# Patient Record
Sex: Female | Born: 1935 | Race: Black or African American | Hispanic: No | Smoking: Never smoker
Health system: Southern US, Community
[De-identification: ages and names within clinical notes are randomized; demographics above are authoritative.]

## PROBLEM LIST (undated history)

## (undated) DIAGNOSIS — K222 Esophageal obstruction: Secondary | ICD-10-CM

## (undated) DIAGNOSIS — K219 Gastro-esophageal reflux disease without esophagitis: Secondary | ICD-10-CM

## (undated) DIAGNOSIS — L2089 Other atopic dermatitis: Secondary | ICD-10-CM

## (undated) DIAGNOSIS — R7309 Other abnormal glucose: Secondary | ICD-10-CM

## (undated) DIAGNOSIS — D649 Anemia, unspecified: Secondary | ICD-10-CM

## (undated) DIAGNOSIS — K5900 Constipation, unspecified: Secondary | ICD-10-CM

## (undated) DIAGNOSIS — M545 Low back pain: Secondary | ICD-10-CM

## (undated) DIAGNOSIS — F329 Major depressive disorder, single episode, unspecified: Secondary | ICD-10-CM

## (undated) DIAGNOSIS — M2550 Pain in unspecified joint: Secondary | ICD-10-CM

## (undated) DIAGNOSIS — R609 Edema, unspecified: Secondary | ICD-10-CM

## (undated) DIAGNOSIS — I82409 Acute embolism and thrombosis of unspecified deep veins of unspecified lower extremity: Secondary | ICD-10-CM

## (undated) DIAGNOSIS — I82619 Acute embolism and thrombosis of superficial veins of unspecified upper extremity: Secondary | ICD-10-CM

## (undated) DIAGNOSIS — E7800 Pure hypercholesterolemia, unspecified: Secondary | ICD-10-CM

## (undated) DIAGNOSIS — N289 Disorder of kidney and ureter, unspecified: Principal | ICD-10-CM

## (undated) DIAGNOSIS — E669 Obesity, unspecified: Secondary | ICD-10-CM

## (undated) DIAGNOSIS — F0390 Unspecified dementia without behavioral disturbance: Secondary | ICD-10-CM

## (undated) HISTORY — DX: Low back pain: M54.5

## (undated) HISTORY — DX: Gastro-esophageal reflux disease without esophagitis: K21.9

## (undated) HISTORY — DX: Obesity, unspecified: E66.9

## (undated) HISTORY — DX: Esophageal obstruction: K22.2

## (undated) HISTORY — DX: Edema, unspecified: R60.9

## (undated) HISTORY — DX: Pain in unspecified joint: M25.50

## (undated) HISTORY — DX: Other abnormal glucose: R73.09

## (undated) HISTORY — DX: Major depressive disorder, single episode, unspecified: F32.9

## (undated) HISTORY — DX: Constipation, unspecified: K59.00

## (undated) HISTORY — DX: Anemia, unspecified: D64.9

## (undated) HISTORY — DX: Disorder of kidney and ureter, unspecified: N28.9

## (undated) HISTORY — DX: Unspecified dementia without behavioral disturbance: F03.90

## (undated) HISTORY — DX: Acute embolism and thrombosis of unspecified deep veins of unspecified lower extremity: I82.409

## (undated) HISTORY — DX: Other atopic dermatitis: L20.89

## (undated) HISTORY — DX: Pure hypercholesterolemia, unspecified: E78.00

## (undated) HISTORY — DX: Acute embolism and thrombosis of superficial veins of unspecified upper extremity: I82.619

---

## 1978-12-03 HISTORY — PX: OTHER SURGICAL HISTORY: SHX169

## 1983-12-04 HISTORY — PX: OTHER SURGICAL HISTORY: SHX169

## 2000-11-04 ENCOUNTER — Other Ambulatory Visit: Admission: RE | Admit: 2000-11-04 | Discharge: 2000-11-04 | Payer: Self-pay | Admitting: Emergency Medicine

## 2000-11-12 ENCOUNTER — Encounter: Admission: RE | Admit: 2000-11-12 | Discharge: 2000-11-12 | Payer: Self-pay | Admitting: Emergency Medicine

## 2000-11-12 ENCOUNTER — Encounter: Payer: Self-pay | Admitting: Emergency Medicine

## 2001-07-16 ENCOUNTER — Encounter: Admission: RE | Admit: 2001-07-16 | Discharge: 2001-07-16 | Payer: Self-pay | Admitting: Emergency Medicine

## 2001-07-16 ENCOUNTER — Encounter (INDEPENDENT_AMBULATORY_CARE_PROVIDER_SITE_OTHER): Payer: Self-pay | Admitting: *Deleted

## 2001-07-16 ENCOUNTER — Encounter: Payer: Self-pay | Admitting: Emergency Medicine

## 2004-10-05 ENCOUNTER — Ambulatory Visit: Payer: Self-pay | Admitting: Gastroenterology

## 2004-10-13 ENCOUNTER — Encounter: Admission: RE | Admit: 2004-10-13 | Discharge: 2004-10-13 | Payer: Self-pay | Admitting: Emergency Medicine

## 2004-10-16 ENCOUNTER — Ambulatory Visit: Payer: Self-pay | Admitting: Gastroenterology

## 2004-10-16 LAB — HM COLONOSCOPY

## 2006-07-04 ENCOUNTER — Encounter: Admission: RE | Admit: 2006-07-04 | Discharge: 2006-07-04 | Payer: Self-pay | Admitting: Emergency Medicine

## 2006-07-04 ENCOUNTER — Encounter (INDEPENDENT_AMBULATORY_CARE_PROVIDER_SITE_OTHER): Payer: Self-pay | Admitting: *Deleted

## 2009-05-05 ENCOUNTER — Emergency Department (HOSPITAL_COMMUNITY): Admission: EM | Admit: 2009-05-05 | Discharge: 2009-05-05 | Payer: Self-pay | Admitting: Emergency Medicine

## 2009-08-15 ENCOUNTER — Emergency Department (HOSPITAL_COMMUNITY): Admission: EM | Admit: 2009-08-15 | Discharge: 2009-08-15 | Payer: Self-pay | Admitting: Emergency Medicine

## 2010-03-01 ENCOUNTER — Encounter: Payer: Self-pay | Admitting: Gastroenterology

## 2010-04-12 ENCOUNTER — Encounter: Payer: Self-pay | Admitting: Gastroenterology

## 2010-06-27 ENCOUNTER — Encounter: Payer: Self-pay | Admitting: Gastroenterology

## 2010-06-29 ENCOUNTER — Encounter (INDEPENDENT_AMBULATORY_CARE_PROVIDER_SITE_OTHER): Payer: Self-pay | Admitting: *Deleted

## 2010-08-01 DIAGNOSIS — L2089 Other atopic dermatitis: Secondary | ICD-10-CM

## 2010-08-01 DIAGNOSIS — K5900 Constipation, unspecified: Secondary | ICD-10-CM

## 2010-08-01 DIAGNOSIS — R7309 Other abnormal glucose: Secondary | ICD-10-CM

## 2010-08-01 DIAGNOSIS — M2550 Pain in unspecified joint: Secondary | ICD-10-CM | POA: Insufficient documentation

## 2010-08-01 DIAGNOSIS — K219 Gastro-esophageal reflux disease without esophagitis: Secondary | ICD-10-CM

## 2010-08-01 DIAGNOSIS — N183 Chronic kidney disease, stage 3 (moderate): Secondary | ICD-10-CM

## 2010-08-01 DIAGNOSIS — I82619 Acute embolism and thrombosis of superficial veins of unspecified upper extremity: Secondary | ICD-10-CM

## 2010-08-01 DIAGNOSIS — I10 Essential (primary) hypertension: Secondary | ICD-10-CM | POA: Insufficient documentation

## 2010-08-01 DIAGNOSIS — N289 Disorder of kidney and ureter, unspecified: Secondary | ICD-10-CM

## 2010-08-01 DIAGNOSIS — E7800 Pure hypercholesterolemia, unspecified: Secondary | ICD-10-CM

## 2010-08-01 DIAGNOSIS — M545 Low back pain, unspecified: Secondary | ICD-10-CM

## 2010-08-01 DIAGNOSIS — F329 Major depressive disorder, single episode, unspecified: Secondary | ICD-10-CM

## 2010-08-01 DIAGNOSIS — E669 Obesity, unspecified: Secondary | ICD-10-CM

## 2010-08-01 DIAGNOSIS — F3289 Other specified depressive episodes: Secondary | ICD-10-CM | POA: Insufficient documentation

## 2010-08-01 DIAGNOSIS — K222 Esophageal obstruction: Secondary | ICD-10-CM

## 2010-08-01 HISTORY — DX: Low back pain, unspecified: M54.50

## 2010-08-01 HISTORY — DX: Constipation, unspecified: K59.00

## 2010-08-01 HISTORY — DX: Obesity, unspecified: E66.9

## 2010-08-01 HISTORY — DX: Acute embolism and thrombosis of superficial veins of unspecified upper extremity: I82.619

## 2010-08-01 HISTORY — DX: Pure hypercholesterolemia, unspecified: E78.00

## 2010-08-01 HISTORY — DX: Other atopic dermatitis: L20.89

## 2010-08-01 HISTORY — DX: Disorder of kidney and ureter, unspecified: N28.9

## 2010-08-01 HISTORY — DX: Pain in unspecified joint: M25.50

## 2010-08-01 HISTORY — DX: Major depressive disorder, single episode, unspecified: F32.9

## 2010-08-01 HISTORY — DX: Other specified depressive episodes: F32.89

## 2010-08-01 HISTORY — DX: Esophageal obstruction: K22.2

## 2010-08-01 HISTORY — DX: Other abnormal glucose: R73.09

## 2010-08-01 HISTORY — DX: Gastro-esophageal reflux disease without esophagitis: K21.9

## 2010-08-08 ENCOUNTER — Ambulatory Visit: Payer: Self-pay | Admitting: Gastroenterology

## 2010-08-08 ENCOUNTER — Encounter (INDEPENDENT_AMBULATORY_CARE_PROVIDER_SITE_OTHER): Payer: Self-pay | Admitting: *Deleted

## 2010-08-08 DIAGNOSIS — I82409 Acute embolism and thrombosis of unspecified deep veins of unspecified lower extremity: Secondary | ICD-10-CM | POA: Insufficient documentation

## 2010-08-08 DIAGNOSIS — R609 Edema, unspecified: Secondary | ICD-10-CM

## 2010-08-08 HISTORY — DX: Acute embolism and thrombosis of unspecified deep veins of unspecified lower extremity: I82.409

## 2010-08-08 HISTORY — DX: Edema, unspecified: R60.9

## 2010-08-08 LAB — CONVERTED CEMR LAB
AST: 36 units/L (ref 0–37)
Albumin: 3.9 g/dL (ref 3.5–5.2)
BUN: 28 mg/dL — ABNORMAL HIGH (ref 6–23)
Basophils Relative: 0.6 % (ref 0.0–3.0)
Creatinine, Ser: 1.2 mg/dL (ref 0.4–1.2)
Eosinophils Absolute: 0.1 10*3/uL (ref 0.0–0.7)
Eosinophils Relative: 2.6 % (ref 0.0–5.0)
Folate: 10.9 ng/mL
GFR calc non Af Amer: 55.92 mL/min (ref >60)
Glucose, Bld: 83 mg/dL (ref 70–99)
HCT: 34.9 % — ABNORMAL LOW (ref 36.0–46.0)
Hemoglobin: 12.2 g/dL (ref 12.0–15.0)
Lymphs Abs: 0.9 10*3/uL (ref 0.7–4.0)
MCHC: 35 g/dL (ref 30.0–36.0)
MCV: 102.8 fL — ABNORMAL HIGH (ref 78.0–100.0)
Monocytes Absolute: 0.3 10*3/uL (ref 0.1–1.0)
Neutro Abs: 2.1 10*3/uL (ref 1.4–7.7)
Neutrophils Relative %: 60 % (ref 43.0–77.0)
Potassium: 4.6 meq/L (ref 3.5–5.1)
RBC: 3.4 M/uL — ABNORMAL LOW (ref 3.87–5.11)
TSH: 0.58 microintl units/mL (ref 0.35–5.50)
Vitamin B-12: 755 pg/mL (ref 211–911)
WBC: 3.5 10*3/uL — ABNORMAL LOW (ref 4.5–10.5)

## 2010-08-10 ENCOUNTER — Encounter: Payer: Self-pay | Admitting: Cardiovascular Disease

## 2010-08-10 ENCOUNTER — Encounter: Admission: RE | Admit: 2010-08-10 | Discharge: 2010-08-10 | Payer: Self-pay | Admitting: Internal Medicine

## 2010-08-11 ENCOUNTER — Encounter: Admission: RE | Admit: 2010-08-11 | Discharge: 2010-08-11 | Payer: Self-pay | Admitting: Internal Medicine

## 2010-08-11 ENCOUNTER — Telehealth: Payer: Self-pay | Admitting: Gastroenterology

## 2010-08-11 ENCOUNTER — Encounter: Payer: Self-pay | Admitting: Cardiovascular Disease

## 2010-08-16 ENCOUNTER — Ambulatory Visit: Payer: Self-pay | Admitting: Gastroenterology

## 2010-08-17 ENCOUNTER — Ambulatory Visit: Payer: Self-pay | Admitting: Internal Medicine

## 2010-08-17 LAB — CONVERTED CEMR LAB
Prothrombin Time: 11.5 s (ref 9.7–11.8)
aPTT: 31.1 s — ABNORMAL HIGH (ref 21.7–28.8)

## 2010-08-18 ENCOUNTER — Encounter: Payer: Self-pay | Admitting: Gastroenterology

## 2010-08-18 ENCOUNTER — Telehealth: Payer: Self-pay | Admitting: Internal Medicine

## 2010-08-21 ENCOUNTER — Ambulatory Visit: Payer: Self-pay | Admitting: Cardiovascular Disease

## 2010-08-21 LAB — CONVERTED CEMR LAB: POC INR: 1.6

## 2010-08-24 ENCOUNTER — Telehealth: Payer: Self-pay | Admitting: Internal Medicine

## 2010-08-25 ENCOUNTER — Ambulatory Visit: Payer: Self-pay | Admitting: Internal Medicine

## 2010-08-25 ENCOUNTER — Ambulatory Visit: Payer: Self-pay

## 2010-08-25 ENCOUNTER — Encounter: Payer: Self-pay | Admitting: Cardiovascular Disease

## 2010-08-25 LAB — CONVERTED CEMR LAB: POC INR: 2.2

## 2010-09-05 ENCOUNTER — Ambulatory Visit: Payer: Self-pay | Admitting: Cardiology

## 2010-09-06 ENCOUNTER — Ambulatory Visit: Payer: Self-pay | Admitting: Vascular Surgery

## 2010-09-19 ENCOUNTER — Ambulatory Visit: Payer: Self-pay | Admitting: Cardiology

## 2010-09-20 ENCOUNTER — Ambulatory Visit: Payer: Self-pay | Admitting: Internal Medicine

## 2010-09-21 ENCOUNTER — Telehealth: Payer: Self-pay | Admitting: Internal Medicine

## 2010-09-22 ENCOUNTER — Telehealth: Payer: Self-pay | Admitting: Internal Medicine

## 2010-10-17 ENCOUNTER — Encounter: Payer: Self-pay | Admitting: Internal Medicine

## 2010-10-17 ENCOUNTER — Ambulatory Visit: Payer: Self-pay | Admitting: Cardiovascular Disease

## 2010-10-17 LAB — HM MAMMOGRAPHY: HM Mammogram: NORMAL

## 2010-10-18 ENCOUNTER — Telehealth: Payer: Self-pay | Admitting: Internal Medicine

## 2010-10-31 ENCOUNTER — Telehealth: Payer: Self-pay | Admitting: Internal Medicine

## 2010-11-02 ENCOUNTER — Telehealth: Payer: Self-pay | Admitting: Internal Medicine

## 2010-11-09 ENCOUNTER — Ambulatory Visit: Payer: Self-pay | Admitting: Internal Medicine

## 2010-11-09 DIAGNOSIS — D649 Anemia, unspecified: Secondary | ICD-10-CM

## 2010-11-09 DIAGNOSIS — D638 Anemia in other chronic diseases classified elsewhere: Secondary | ICD-10-CM

## 2010-11-09 DIAGNOSIS — F0390 Unspecified dementia without behavioral disturbance: Secondary | ICD-10-CM

## 2010-11-09 HISTORY — DX: Anemia, unspecified: D64.9

## 2010-11-09 HISTORY — DX: Unspecified dementia, unspecified severity, without behavioral disturbance, psychotic disturbance, mood disturbance, and anxiety: F03.90

## 2010-11-09 LAB — CONVERTED CEMR LAB
Basophils Absolute: 0 10*3/uL (ref 0.0–0.1)
Bilirubin, Direct: 0.1 mg/dL (ref 0.0–0.3)
Calcium: 10.6 mg/dL — ABNORMAL HIGH (ref 8.4–10.5)
Creatinine, Ser: 1.1 mg/dL (ref 0.4–1.2)
Eosinophils Absolute: 0.1 10*3/uL (ref 0.0–0.7)
GFR calc non Af Amer: 59.93 mL/min — ABNORMAL LOW (ref >60.00)
HCT: 36.4 % (ref 36.0–46.0)
Hemoglobin: 12.6 g/dL (ref 12.0–15.0)
Hgb A1c MFr Bld: 5.4 % (ref 4.6–6.5)
Lymphs Abs: 1 10*3/uL (ref 0.7–4.0)
MCHC: 34.4 g/dL (ref 30.0–36.0)
Monocytes Absolute: 0.3 10*3/uL (ref 0.1–1.0)
Neutro Abs: 1.7 10*3/uL (ref 1.4–7.7)
RDW: 12.7 % (ref 11.5–14.6)
Total Bilirubin: 0.5 mg/dL (ref 0.3–1.2)
Total Protein: 7.6 g/dL (ref 6.0–8.3)

## 2010-11-14 ENCOUNTER — Ambulatory Visit: Payer: Self-pay

## 2010-11-17 ENCOUNTER — Telehealth: Payer: Self-pay | Admitting: Internal Medicine

## 2010-11-20 ENCOUNTER — Telehealth: Payer: Self-pay | Admitting: Internal Medicine

## 2010-11-24 ENCOUNTER — Telehealth: Payer: Self-pay | Admitting: Internal Medicine

## 2010-11-30 ENCOUNTER — Telehealth: Payer: Self-pay | Admitting: Internal Medicine

## 2010-11-30 ENCOUNTER — Encounter: Payer: Self-pay | Admitting: Internal Medicine

## 2010-12-11 ENCOUNTER — Ambulatory Visit: Admission: RE | Admit: 2010-12-11 | Discharge: 2010-12-11 | Payer: Self-pay | Source: Home / Self Care

## 2010-12-11 LAB — CONVERTED CEMR LAB: POC INR: 2.2

## 2011-01-02 ENCOUNTER — Telehealth: Payer: Self-pay | Admitting: Internal Medicine

## 2011-01-04 NOTE — Assessment & Plan Note (Signed)
Summary: New / Medicare / # cd   Vital Signs:  Patient profile:   77 year old female Height:      61 inches Weight:      185 pounds BMI:     35.08 O2 Sat:      98 % on Room air Temp:     99.2 degrees F oral Pulse rate:   80 / minute Pulse rhythm:   regular Resp:     16 per minute BP sitting:   160 / 80  (left arm) Cuff size:   regular  Vitals Entered By: Lanier Prude, Beverly Gust) (August 17, 2010 1:18 PM)  Nutrition Counseling: Patient's BMI is greater than 25 and therefore counseled on weight management options.  O2 Flow:  Room air CC: Lt leg pain/discoloration X 6 mo, Preventive Care Is Patient Diabetic? No Comments Est PCP. pt is taking Lasix 1 qd but is unsure of dose.   Primary Care Provider:  Etta Grandchild MD  CC:  Lt leg pain/discoloration X 6 mo and Preventive Care.  History of Present Illness: New to me she requests treatment for blood clots in her legs. The details are sketchy to me due to the patient and family's difficulty with details but she did have an U/S done on 09/06 and it showed a left greater saphenous vein partially occlusive thrombus. She tells me that has been on Coumadin in the past but her prior PCP stopped it for reasons that she can't recall and those records are not available to me today. She did not have any bleeding complications.  Preventive Screening-Counseling & Management  Alcohol-Tobacco     Alcohol drinks/day: 0     Alcohol Counseling: not indicated; patient does not drink     Smoking Status: never     Tobacco Counseling: not indicated; no tobacco use  Clinical Review Panels:  Prevention   Last Colonoscopy:  DONE (08/16/2010)  Diabetes Management   Creatinine:  1.2 (08/08/2010)  CBC   WBC:  3.5 (08/08/2010)   RBC:  3.40 (08/08/2010)   Hgb:  12.2 (08/08/2010)   Hct:  34.9 (08/08/2010)   Platelets:  159.0 (08/08/2010)   MCV  102.8 (08/08/2010)   MCHC  35.0 (08/08/2010)   RDW  12.8 (08/08/2010)   PMN:  60.0  (08/08/2010)   Lymphs:  27.3 (08/08/2010)   Monos:  9.5 (08/08/2010)   Eosinophils:  2.6 (08/08/2010)   Basophil:  0.6 (08/08/2010)  Complete Metabolic Panel   Glucose:  83 (08/08/2010)   Sodium:  141 (08/08/2010)   Potassium:  4.6 (08/08/2010)   Chloride:  106 (08/08/2010)   CO2:  28 (08/08/2010)   BUN:  28 (08/08/2010)   Creatinine:  1.2 (08/08/2010)   Albumin:  3.9 (08/08/2010)   Total Protein:  7.4 (08/08/2010)   Calcium:  10.3 (08/08/2010)   Total Bili:  0.6 (08/08/2010)   Alk Phos:  65 (08/08/2010)   SGPT (ALT):  23 (08/08/2010)   SGOT (AST):  36 (08/08/2010)   Medications Prior to Update: 1)  Metoprolol Tartrate 25 Mg Tabs (Metoprolol Tartrate) .... Take One By Mouth Two Times A Day 2)  Amlodipine Besylate 10 Mg Tabs (Amlodipine Besylate) .... Take One By Mouth Once Daily 3)  Calcium 500+d 500-400 Mg-Unit Tabs (Calcium Carbonate-Vitamin D) .... Take One By Mouth Three Times A Day 4)  Clonidine Hcl 0.2 Mg Tabs (Clonidine Hcl) .... Take One By Mouth Two Times A Day 5)  Citalopram Hydrobromide 10 Mg Tabs (Citalopram Hydrobromide) .... Take  One By Mouth Once Daily 6)  Klor-Con M20 20 Meq Cr-Tabs (Potassium Chloride Crys Cr) .... Take One By Mouth Two Times A Day 7)  Omeprazole 20 Mg Cpdr (Omeprazole) .... Take One By Mouth Once Daily 8)  Pravastatin Sodium 40 Mg Tabs (Pravastatin Sodium) .... Take One By Mouth Once Daily 9)  Moviprep 100 Gm  Solr (Peg-Kcl-Nacl-Nasulf-Na Asc-C) .... As Per Prep Instructions. 10)  Miralax  Powd (Polyethylene Glycol 3350) .... Use One Capfull in 8 Ounces of Water and Drink Nightly  Current Medications (verified): 1)  Metoprolol Tartrate 25 Mg Tabs (Metoprolol Tartrate) .... Take One By Mouth Two Times A Day 2)  Amlodipine Besylate 10 Mg Tabs (Amlodipine Besylate) .... Take One By Mouth Once Daily 3)  Calcium 500+d 500-400 Mg-Unit Tabs (Calcium Carbonate-Vitamin D) .... Take One By Mouth Three Times A Day 4)  Clonidine Hcl 0.2 Mg Tabs (Clonidine  Hcl) .... Take One By Mouth Two Times A Day 5)  Citalopram Hydrobromide 10 Mg Tabs (Citalopram Hydrobromide) .... Take One By Mouth Once Daily 6)  Klor-Con M20 20 Meq Cr-Tabs (Potassium Chloride Crys Cr) .... Take One By Mouth Two Times A Day 7)  Omeprazole 20 Mg Cpdr (Omeprazole) .... Take One By Mouth Once Daily 8)  Pravastatin Sodium 40 Mg Tabs (Pravastatin Sodium) .... Take One By Mouth Once Daily 9)  Moviprep 100 Gm  Solr (Peg-Kcl-Nacl-Nasulf-Na Asc-C) .... As Per Prep Instructions. 10)  Miralax  Powd (Polyethylene Glycol 3350) .... Use One Capfull in 8 Ounces of Water and Drink Nightly 11)  Keflex 500 Mg Caps (Cephalexin) .... 2 Once Daily 12)  Tylenol With Codeine #3 300-30 Mg Tabs (Acetaminophen-Codeine) .Marland Kitchen.. 1 At Bedtime 13)  Coumadin 5 Mg Tabs (Warfarin Sodium) .... One By Mouth Once Daily  Allergies (verified): 1)  ! Amlodipine Besylate  Past History:  Past Medical History: Last updated: 08/01/2010 Current Problems:  PERSONAL HISTORY OF COLONIC POLYPS (ICD-V12.72) HEMORRHOIDS (ICD-455.6) GERD (ICD-530.81) ESOPHAGEAL STRICTURE (ICD-530.3) HIATAL HERNIA (ICD-553.3) ENCOUNTER FOR LONG-TERM USE OF ANTICOAGULANTS (ICD-V58.61) HYPERGLYCEMIA (ICD-790.29) COUGH (ICD-786.2) WEIGHT LOSS, ABNORMAL (ICD-783.21) MEMORY LOSS (ICD-780.93) BACK PAIN, LUMBAR (ICD-724.2) ARTHRALGIA (ICD-719.40) ECZEMA, ATOPIC (ICD-691.8) RENAL DISEASE (ICD-593.9) CONSTIPATION (ICD-564.00) ACUTE VENOUS EMBO & THROMB SUP VEINS UPPER EXT (ICD-453.81) HYPERTENSION (ICD-401.9) DEPRESSION (ICD-311) OBESITY, UNSPECIFIED (ICD-278.00) HYPOKALEMIA (ICD-276.8) HYPERCHOLESTEROLEMIA (ICD-272.0)  Past Surgical History: Last updated: 08/01/2010 1980-tumor removed 1985-blood clots removed  Family History: Last updated: 08/08/2010 Family History of Colon Cancer:Brother Family History of Diabetes: Brother  Social History: Last updated: 08/01/2010 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  Alcohol Use - no Daily Caffeine Use  Risk Factors: Alcohol Use: 0 (08/17/2010)  Risk Factors: Smoking Status: never (08/17/2010)  Family History: Reviewed history from 08/08/2010 and no changes required. Family History of Colon Cancer:Brother Family History of Diabetes: Brother  Social History: Reviewed history from 08/01/2010 and no changes required. 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  Alcohol Use - no Daily Caffeine Use  Review of Systems       The patient complains of weight gain and peripheral edema.  The patient denies anorexia, fever, weight loss, hoarseness, chest pain, syncope, prolonged cough, headaches, hemoptysis, abdominal pain, hematuria, suspicious skin lesions, abnormal bleeding, enlarged lymph nodes, angioedema, and breast masses.   General:  Denies chills, fatigue, fever, loss of appetite, malaise, sleep disorder, and sweats. Heme:  Denies abnormal bruising, bleeding, enlarge lymph nodes,  fevers, pallor, and skin discoloration.  Physical Exam  General:  alert, well-developed, well-nourished, well-hydrated, appropriate dress, normal appearance, healthy-appearing, cooperative to examination, and overweight-appearing.   Head:  normocephalic, atraumatic, no abnormalities observed, and no abnormalities palpated.   Mouth:  Oral mucosa and oropharynx without lesions or exudates.  Teeth in good repair. Neck:  supple, full ROM, no masses, no thyromegaly, no JVD, normal carotid upstroke, no carotid bruits, no cervical lymphadenopathy, and no neck tenderness.   Breasts:  skin/areolae normal, no masses, no abnormal thickening, no nipple discharge, no tenderness, and no adenopathy.   Lungs:  normal respiratory effort, no intercostal retractions, no accessory muscle use, normal breath sounds, no dullness, no fremitus, no crackles,  and no wheezes.   Heart:  normal rate, regular rhythm, no murmur, no gallop, no rub, and no JVD.   Abdomen:  soft, non-tender, normal bowel sounds, no distention, no masses, no guarding, no rigidity, no rebound tenderness, no abdominal hernia, no inguinal hernia, no hepatomegaly, and no splenomegaly.   Msk:  normal ROM, no joint tenderness, no joint swelling, no joint warmth, no redness over joints, no joint deformities, no joint instability, no crepitation, and no muscle atrophy.   Pulses:  R radial normal, L radial normal, R femoral decreased, R popliteal decreased, R posterior tibial decreased, R dorsalis pedis decreased, L femoral decreased, L popliteal decreased, L posterior tibial decreased, and L dorsalis pedis decreased.   Extremities:  2+ left pedal edema with brawny induration and hyperpigmentation and 1+ right pedal edema.   Neurologic:  alert & oriented X3, cranial nerves II-XII intact, strength normal in all extremities, sensation intact to light touch, sensation intact to pinprick, gait normal, and DTRs symmetrical and normal.   Psych:  normally interactive, good eye contact, not anxious appearing, not depressed appearing, easily distracted, poor concentration, and memory impairment.     Impression & Recommendations:  Problem # 1:  ENCOUNTER FOR LONG-TERM USE OF ANTICOAGULANTS (ICD-V58.61) Assessment Unchanged  Orders: Venipuncture (14782) TLB-PTT (85730-PTTL) TLB-PT (Protime) (85610-PTP) T-D-Dimer Fibrin Derivatives Quantitive ([id number])  Problem # 2:  ACUTE VENOUS EMBO & THROMB SUP VEINS UPPER EXT (ICD-453.81) Assessment: Deteriorated she may still having clot forming and she is certainly symptomatic with pain and swelling so I think she shoud restart Coumadin Orders: [facility] Referral (Coumadin clinic) Venipuncture (78469) TLB-PTT (85730-PTTL) TLB-PT (Protime) (85610-PTP) T-D-Dimer Fibrin Derivatives Quantitive ([id number])  Problem # 3:   HYPERTENSION (ICD-401.9) Assessment: Deteriorated  The following medications were removed from the medication list:    Amlodipine Besylate 10 Mg Tabs (Amlodipine besylate) .Marland Kitchen... Take one by mouth once daily Her updated medication list for this problem includes:    Metoprolol Tartrate 25 Mg Tabs (Metoprolol tartrate) .Marland Kitchen... Take one by mouth two times a day    Clonidine Hcl 0.2 Mg Tabs (Clonidine hcl) .Marland Kitchen... Take one by mouth two times a day    Losartan Potassium-hctz 50-12.5 Mg Tabs (Losartan potassium-hctz) ..... One by mouth once daily for high blood pressure  Problem # 4:  EDEMA (ICD-782.3) Assessment: Deteriorated will stop norvasc Her updated medication list for this problem includes:    Losartan Potassium-hctz 50-12.5 Mg Tabs (Losartan potassium-hctz) ..... One by mouth once daily for high blood pressure  Complete Medication List: 1)  Metoprolol Tartrate 25 Mg Tabs (Metoprolol tartrate) .... Take one by mouth two times a day 2)  Calcium 500+d 500-400 Mg-unit Tabs (Calcium carbonate-vitamin d) .... Take one by mouth three times a day 3)  Clonidine Hcl 0.2 Mg  Tabs (Clonidine hcl) .... Take one by mouth two times a day 4)  Citalopram Hydrobromide 10 Mg Tabs (Citalopram hydrobromide) .... Take one by mouth once daily 5)  Klor-con M20 20 Meq Cr-tabs (Potassium chloride crys cr) .... Take one by mouth two times a day 6)  Omeprazole 20 Mg Cpdr (Omeprazole) .... Take one by mouth once daily 7)  Pravastatin Sodium 40 Mg Tabs (Pravastatin sodium) .... Take one by mouth once daily 8)  Moviprep 100 Gm Solr (Peg-kcl-nacl-nasulf-na asc-c) .... As per prep instructions. 9)  Miralax Powd (Polyethylene glycol 3350) .... Use one capfull in 8 ounces of water and drink nightly 10)  Keflex 500 Mg Caps (Cephalexin) .... 2 once daily 11)  Tylenol With Codeine #3 300-30 Mg Tabs (Acetaminophen-codeine) .Marland Kitchen.. 1 at bedtime 12)  Coumadin 5 Mg Tabs (Warfarin sodium) .... One by mouth once daily 13)  Losartan  Potassium-hctz 50-12.5 Mg Tabs (Losartan potassium-hctz) .... One by mouth once daily for high blood pressure  Other Orders: Radiology Referral (Radiology)  PAP Screening:    Reviewed PAP smear recommendations:  patient refuses understanding risks of delayed diagnosis  Mammogram Screening:    Reviewed Mammogram recommendations:  mammogram ordered  Osteoporosis Risk Assessment:  Risk Factors for Fracture or Low Bone Density:   Smoking status:       never  Patient Instructions: 1)  Please schedule a follow-up appointment in 1 month. Prescriptions: LOSARTAN POTASSIUM-HCTZ 50-12.5 MG TABS (LOSARTAN POTASSIUM-HCTZ) One by mouth once daily for high blood pressure  #30 x 11   Entered and Authorized by:   Etta Grandchild MD   Signed by:   Etta Grandchild MD on 08/17/2010   Method used:   Electronically to        [facility] (retail)       [address]       [place], [place]  [address]       Ph: [phone]       Fax: [phone]   RxID:   [id number] COUMADIN 5 MG TABS (WARFARIN SODIUM) One by mouth once daily  #30 x 11   Entered and Authorized by:   Etta Grandchild MD   Signed by:   Etta Grandchild MD on 08/17/2010   Method used:   Electronically to        [facility] (retail)       [address]       [place], [place]  [address]       Ph: [phone]       Fax: [phone]   RxID:   [id number]

## 2011-01-04 NOTE — Letter (Signed)
Summary: [facility] Instructions  [facility]  [address] [place], [place] [address]   Phone: [phone]  Fax: [phone]       Annette Mckay    Aug 24, 1927    MRN: [id number]        Procedure Day Dorna Bloom: Wednesday 08/16/2010     Arrival Time: 2:30pm       Procedure Time: 3:30pm     Location of Procedure:                    X  [facility] (4th Floor)   PREPARATION FOR COLONOSCOPY WITH MOVIPREP   Starting 5 days prior to your procedure 08/11/2010 do not eat nuts, seeds, popcorn, corn, beans, peas,  salads, or any raw vegetables.  Do not take any fiber supplements (e.g. Metamucil, Citrucel, and Benefiber).  THE DAY BEFORE YOUR PROCEDURE         DATE: 08/15/2010  DAY: Tuesday  1.  Drink clear liquids the entire day-NO SOLID FOOD  2.  Do not drink anything colored red or purple.  Avoid juices with pulp.  No orange juice.  3.  Drink at least 64 oz. (8 glasses) of fluid/clear liquids during the day to prevent dehydration and help the prep work efficiently.  CLEAR LIQUIDS INCLUDE: Water Jello Ice Popsicles Tea (sugar ok, no milk/cream) Powdered fruit flavored drinks Coffee (sugar ok, no milk/cream) Gatorade Juice: apple, white grape, white cranberry  Lemonade Clear bullion, consomm, broth Carbonated beverages (any kind) Strained chicken noodle soup Hard Candy                             4.  In the morning, mix first dose of MoviPrep solution:    Empty 1 Pouch A and 1 Pouch B into the disposable container    Add lukewarm drinking water to the top line of the container. Mix to dissolve    Refrigerate (mixed solution should be used within 24 hrs)  5.  Begin drinking the prep at 5:00 p.m. The MoviPrep container is divided by 4 marks.   Every 15 minutes drink the solution down to the next mark (approximately 8 oz) until the full liter is complete.   6.  Follow completed prep with 16 oz of clear liquid of your choice (Nothing red or purple).   Continue to drink clear liquids until bedtime.  7.  Before going to bed, mix second dose of MoviPrep solution:    Empty 1 Pouch A and 1 Pouch B into the disposable container    Add lukewarm drinking water to the top line of the container. Mix to dissolve    Refrigerate  THE DAY OF YOUR PROCEDURE      DATE: 08/16/2010 DAY: Wednesday  Beginning at 10:30am (5 hours before procedure):         1. Every 15 minutes, drink the solution down to the next mark (approx 8 oz) until the full liter is complete.  2. Follow completed prep with 16 oz. of clear liquid of your choice.    3. You may drink clear liquids until 1:30pm (2 HOURS BEFORE PROCEDURE).   MEDICATION INSTRUCTIONS  Unless otherwise instructed, you should take regular prescription medications with a small sip of water   as early as possible the morning of your procedure.          OTHER INSTRUCTIONS  You will need a responsible adult at least 75 years  of age to accompany you and drive you home.   This person must remain in the waiting room during your procedure.  Wear loose fitting clothing that is easily removed.  Leave jewelry and other valuables at home.  However, you may wish to bring a book to read or  an iPod/MP3 player to listen to music as you wait for your procedure to start.  Remove all body piercing jewelry and leave at home.  Total time from sign-in until discharge is approximately 2-3 hours.  You should go home directly after your procedure and rest.  You can resume normal activities the  day after your procedure.  The day of your procedure you should not:   Drive   Make legal decisions   Operate machinery   Drink alcohol   Return to work  You will receive specific instructions about eating, activities and medications before you leave.    The above instructions have been reviewed and explained to me by   _______________________    I fully understand and can verbalize these instructions  _____________________________ Date _________

## 2011-01-04 NOTE — Procedures (Signed)
Summary: EGD   EGD  Procedure date:  10/16/2004  Findings:      Location: [facility]   Patient Name: Annette Mckay, Annette Mckay MRN:  Procedure Procedures: Panendoscopy (EGD) CPT: 43235.    with esophageal dilation. CPT: G9296129.  Personnel: Endoscopist: Vania Rea. Jarold Motto, MD.  Exam Location: Exam performed in Outpatient Clinic. Outpatient  Patient Consent: Procedure, Alternatives, Risks and Benefits discussed, consent obtained, from patient. Consent was obtained by the RN.  Indications Symptoms: Reflux symptoms for >10 yrs, occurring daily.  History  Current Medications: Patient is currently taking Coumadin.  Pre-Exam Physical: Performed Oct 16, 2004  Cardio-pulmonary exam, Abdominal exam, Extremity exam, Mental status exam WNL.  Exam Exam Info: Maximum depth of insertion Duodenum, intended Duodenum. Patient position: on left side. Duration of exam: 15 minutes. Vocal cords visualized. Gastric retroflexion performed. Images taken. ASA Classification: II. Tolerance: excellent.  Sedation Meds: Patient assessed and found to be appropriate for moderate (conscious) sedation. Cetacaine Spray 2 sprays given aerosolized. Versed 2 mg. given IV.  Monitoring: BP and pulse monitoring done. Oximetry used. Supplemental O2 given at 2 Liters.  Instrument(s): GIF 160. Serial [id number].   Findings - HIATAL HERNIA: Prolapsing, 5 cms. in length. ICD9: Hernia, Hiatal: 553.3. - STRICTURE / STENOSIS: Distal Esophagus.  Constriction: partial. Lumen diameter is 14 mm. ICD9: Esophageal Stricture: 530.3.  - Dilation: Distal Esophagus. Maloney dilator used, Diameter: 56 F, Minimal Resistance, No Heme present on extraction. 1  total dilators used. Patient tolerance excellent. Outcome: successful.  - Normal: Body to Duodenal 2nd Portion. Not Seen: Tumor. Ulcer. Mucosal abnormality. AVM's. Foreign body.   Assessment  Diagnoses: 553.3: Hernia, Hiatal. Chronic GERD.  530.3: Esophageal  Stricture.   Events  Unplanned Intervention: No unplanned interventions were required.  Plans Medication(s): Continue current medications.  Patient Education: Patient given standard instructions for: Reflux.  Disposition: After procedure patient sent to recovery. After recovery patient sent home.  Scheduling: Follow-up prn.    CC: Leslee Home, MD  This report was created from the original endoscopy report, which was reviewed and signed by the above listed endoscopist.

## 2011-01-04 NOTE — Progress Notes (Signed)
Summary: rx refill req  Phone Note Refill Request Message from:  Fax from Pharmacy on November 30, 2010 9:15 AM  Refills Requested: Medication #1:  TYLENOL WITH CODEINE #3 300-30 MG TABS 1 at bedtime   Dosage confirmed as above?Dosage Confirmed   Supply Requested: 6 months   Last Refilled: 08/15/2010 please advise   Method Requested: Fax to Local Pharmacy Next Appointment Scheduled: 01/18/2011 Initial call taken by: Brenton Grills CMA [facility] Dull),  November 30, 2010 9:16 AM  Follow-up for Phone Call        ok Follow-up by: Etta Grandchild MD,  November 30, 2010 9:21 AM    Prescriptions: TYLENOL WITH CODEINE #3 300-30 MG TABS (ACETAMINOPHEN-CODEINE) 1 at bedtime  #15 x 5   Entered by:   Brenton Grills CMA (AAMA)   Authorized by:   Etta Grandchild MD   Signed by:   Brenton Grills CMA (AAMA) on 11/30/2010   Method used:   Telephoned to ...       [facility] (retail)       [address]       [place], [place]  [address]       Ph: [phone]       Fax: [phone]   RxID:   [id number]

## 2011-01-04 NOTE — Progress Notes (Signed)
Summary: Lab results  Phone Note Call from Patient Call back at Home Phone [phone]   Call For: Dr Jarold Motto Reason for Call: Lab or Test Results Summary of Call: wants to know about her labs Initial call taken by: Leanor Kail [facility],  August 11, 2010 1:08 PM  Follow-up for Phone Call        Left a message on patients machine to call back if she has questions but all her labs are normal.  Follow-up by: Harlow Mares CMA (AAMA),  August 11, 2010 2:05 PM

## 2011-01-04 NOTE — Letter (Signed)
Summary: New Patient letter  [facility]  [address] [place], [place] [address]   Phone: [phone]  Fax: [phone]       06/29/2010 MRN: [id number]  [facility] [address] [place], [place]  [address]  Dear Ms. Annette Mckay,  Welcome to the Gastroenterology Division at [facility].    You are scheduled to see Dr.  Jarold Motto on 9-6-11p.m. at 2:00p.m. on the 3rd floor at [facility], [address]  We ask that you try to arrive at our office 15 minutes prior to your appointment time to allow for check-in.  We would like you to complete the enclosed self-administered evaluation form prior to your visit and bring it with you on the day of your appointment.  We will review it with you.  Also, please bring a complete list of all your medications or, if you prefer, bring the medication bottles and we will list them.  Please bring your insurance card so that we may make a copy of it.  If your insurance requires a referral to see a specialist, please bring your referral form from your primary care physician.  Co-payments are due at the time of your visit and may be paid by cash, check or credit card.     Your office visit will consist of a consult with your physician (includes a physical exam), any laboratory testing he/she may order, scheduling of any necessary diagnostic testing (e.g. x-ray, ultrasound, CT-scan), and scheduling of a procedure (e.g. Endoscopy, Colonoscopy) if required.  Please allow enough time on your schedule to allow for any/all of these possibilities.    If you cannot keep your appointment, please call [phone] to cancel or reschedule prior to your appointment date.  This allows [place] the opportunity to schedule an appointment for another patient in need of care.  If you do not cancel or reschedule by 5 p.m. the business day prior to your appointment date, you will be charged a $50.00 late cancellation/no-show fee.    Thank you for  choosing [facility] for your medical needs.  We appreciate the opportunity to care for you.  Please visit [place] at our website  to learn more about our practice.                     Sincerely,                                                             The Gastroenterology Division

## 2011-01-04 NOTE — Medication Information (Signed)
Summary: ccn/mt  Anticoagulant Therapy  Managed by: Geoffry Paradise, PharmD Referring MD: Yetta Barre MD PCP: Etta Grandchild MD Supervising MD: Excell Seltzer MD, Casimiro Needle Indication 1: DVT Lab Used: [facility] [place] Site: [address] INR POC 1.6 INR RANGE 2-3  Vital Signs: Weight: 180 lbs.     Dietary changes: no    Health status changes: no    Bleeding/hemorrhagic complications: no    Recent/future hospitalizations: no    Any changes in medication regimen? no    Recent/future dental: no  Any missed doses?: no       Is patient compliant with meds? yes       Allergies: 1)  ! Amlodipine Besylate  Anticoagulation Management History:      The patient comes in today for her initial visit for anticoagulation therapy.  Positive risk factors for bleeding include an age of 75 years or older.  The bleeding index is 'intermediate risk'.  Positive CHADS2 values include History of HTN and Age > 31 years old.  Her last INR was 1.1 ratio.  Anticoagulation responsible provider: Excell Seltzer MD, Casimiro Needle.  INR POC: 1.6.  Cuvette Lot#: [id number].  Exp: 09/2011.    Anticoagulation Management Assessment/Plan:      The patient's current anticoagulation dose is Coumadin 5 mg tabs: One by mouth once daily.  The next INR is due 08/28/2010.  Results were reviewed/authorized by Geoffry Paradise, PharmD.         Current Anticoagulation Instructions: INR: 1.6  Please continue to take 1 tablet (5mg ) everyday.

## 2011-01-04 NOTE — Miscellaneous (Signed)
Summary: Orders Update  Clinical Lists Changes  Orders: Added new Test order of Venous Duplex Lower Extremity (Venous Duplex Lower) - Signed 

## 2011-01-04 NOTE — Medication Information (Signed)
Summary: rov/ewj  Anticoagulant Therapy  Managed by: Bethena Midget, RN, BSN Referring MD: Yetta Barre MD PCP: Etta Grandchild MD Supervising MD: Myrtis Ser MD, Tinnie Gens Indication 1: DVT Lab Used: [facility] [place] Site: [address] INR POC 2.4 INR RANGE 2-3  Dietary changes: no    Health status changes: no    Bleeding/hemorrhagic complications: no    Recent/future hospitalizations: no    Any changes in medication regimen? no    Recent/future dental: no  Any missed doses?: no       Is patient compliant with meds? yes       Allergies: 1)  ! Amlodipine Besylate  Anticoagulation Management History:      The patient is taking warfarin and comes in today for a routine follow up visit.  Positive risk factors for bleeding include an age of 75 years or older.  The bleeding index is 'intermediate risk'.  Positive CHADS2 values include History of HTN and Age > 75 years old.  Her last INR was 1.1 ratio.  Anticoagulation responsible provider: Myrtis Ser MD, Tinnie Gens.  INR POC: 2.4.  Cuvette Lot#: [id number].  Exp: 10/2011.    Anticoagulation Management Assessment/Plan:      The patient's current anticoagulation dose is Coumadin 5 mg tabs: One by mouth once daily.  The target INR is 2.0-3.0.  The next INR is due 10/17/2010.  Anticoagulation instructions were given to patient.  Results were reviewed/authorized by Bethena Midget, RN, BSN.  She was notified by Bethena Midget, RN, BSN.         Prior Anticoagulation Instructions: INR 2.7  Continue on same dosage 1 tablet daily.  Recheck in  2 weeks.    Current Anticoagulation Instructions: INR 2.4 Continue 5mg s everyday. Recheck in 4 weeks.

## 2011-01-04 NOTE — Procedures (Signed)
Summary: Colonoscopy  Patient: Annette Mckay Note: All result statuses are Final unless otherwise noted.  Tests: (1) Colonoscopy (COL)   COL Colonoscopy           DONE     [facility]     [address]     [place], [place]  [address]           COLONOSCOPY PROCEDURE REPORT           PATIENT:  Annette Mckay, Annette Mckay  MR#:  [id number]     BIRTHDATE:  August 24, 1927, 83 yrs. old  GENDER:  female     ENDOSCOPIST:  Vania Rea. Jarold Motto, MD, [facility]     REF. BY:     PROCEDURE DATE:  08/16/2010     PROCEDURE:  Colonoscopy with biopsy     ASA CLASS:  Class II     INDICATIONS:  history of pre-cancerous (adenomatous) colon polyps           MEDICATIONS:   Fentanyl 50 mcg IV, Versed 4 mg IV           DESCRIPTION OF PROCEDURE:   After the risks benefits and     alternatives of the procedure were thoroughly explained, informed     consent was obtained.  POOR RECTAL TONE. The LB [id number]-[id number]     endoscope was introduced through the anus and advanced to the     cecum, which was identified by both the appendix and ileocecal     valve, without limitations.  The quality of the prep was adequate,     using MoviPrep.  The instrument was then slowly withdrawn as the     colon was fully examined.     <<PROCEDUREIMAGES>>           FINDINGS:  A sessile polyp was found in the sigmoid colon. COLD     SNARE REMOVAL OF 3 MM POLYP.  This was otherwise a normal     examination of the colon.   Retroflexed views in the rectum     revealed no abnormalities.    The scope was then withdrawn from     the patient and the procedure completed.           COMPLICATIONS:  None     ENDOSCOPIC IMPRESSION:     1) Sessile polyp in the sigmoid colon     2) Otherwise normal examination     R/O ADENOMA.     RECOMMENDATIONS:     F/U ONLY AS NEEDED PER HER AGE.     REPEAT EXAM:  No           ______________________________     Vania Rea. Jarold Motto, MD, Clementeen Graham           CC:  Doran Durand MD           n.     Rosalie DoctorMarland Kitchen    Vania Rea. Patterson at 08/16/2010 04:17 PM           Fransico Meadow, [id number]  Note: An exclamation mark (!) indicates a result that was not dispersed into the flowsheet. Document Creation Date: 08/16/2010 4:17 PM _______________________________________________________________________  (1) Order result status: Final Collection or observation date-time: 08/16/2010 16:02 Requested date-time:  Receipt date-time:  Reported date-time:  Referring Physician:   Ordering Physician: Sheryn Bison [phone]) Specimen Source:  Source: [name] Order Number: [id number] Lab site:

## 2011-01-04 NOTE — Medication Information (Signed)
Summary: rov/ewj  Anticoagulant Therapy  Managed by: Lyna Poser, PharmD Referring MD: Yetta Barre MD PCP: Etta Grandchild MD Supervising MD: Excell Seltzer MD, Casimiro Needle Indication 1: DVT Lab Used: [facility] [place] Site: [address] INR POC 2.2 INR RANGE 2-3  Dietary changes: no    Health status changes: no    Bleeding/hemorrhagic complications: no    Recent/future hospitalizations: no    Any changes in medication regimen? yes       Details: restarted coumadin last friday after colonscopy  Recent/future dental: no  Any missed doses?: no       Is patient compliant with meds? yes       Allergies: 1)  ! Amlodipine Besylate  Anticoagulation Management History:      Positive risk factors for bleeding include an age of 75 years or older.  The bleeding index is 'intermediate risk'.  Positive CHADS2 values include History of HTN and Age > 25 years old.  Her last INR was 1.1 ratio.  Anticoagulation responsible provider: Excell Seltzer MD, Casimiro Needle.  INR POC: 2.2.  Exp: 09/2011.    Anticoagulation Management Assessment/Plan:      The patient's current anticoagulation dose is Coumadin 5 mg tabs: One by mouth once daily.  The next INR is due 08/28/2010.  Results were reviewed/authorized by Lyna Poser, PharmD.         Prior Anticoagulation Instructions: INR: 1.6  Please continue to take 1 tablet (5mg ) everyday.   Current Anticoagulation Instructions: INR 2.2 Continue taking 1 tablet daily. No changes today. Recheck in 10 days.

## 2011-01-04 NOTE — Assessment & Plan Note (Signed)
Summary: discuss colon age--ch.   History of Present Illness Visit Type: Initial Consult Primary GI MD: Sheryn Bison MD FACP FAGA Primary Provider: Doran Durand, MD Requesting Provider: Doran Durand, MD Chief Complaint: Colon screening, patient has problems with gas, dark stools, constipation & hemorrhoids History of Present Illness:   77 year old American female who had colonoscopy and polypectomy in 2005. She has recurrent chronic constipation but denies other GI complaints. She is referred by Dr. Allena  for colonoscopy followup. She has a history of deep vein thrombosis and chronic edema of her lower extremities with associated pain and immobility. She also has a history of fatty liver confirmed by CT scan in 2003. Review of her labs today shows a creatinine of 1.4 mg percent and mild hypercalcemia. She is on potassium replacement and antihypertensive medication.  Besides constipation she denies other GI complaints. She takes omeprazole daily for acid reflux.Over the Last 5 years she has lost 20 pounds in weight.She has an older brother who died with colon cancer.   GI Review of Systems    Reports belching, bloating, and  weight loss.      Denies abdominal pain, acid reflux, chest pain, dysphagia with liquids, dysphagia with solids, heartburn, loss of appetite, nausea, vomiting, vomiting blood, and  weight gain.      Reports black tarry stools, constipation, and  hemorrhoids.     Denies anal fissure, change in bowel habit, diarrhea, diverticulosis, fecal incontinence, heme positive stool, irritable bowel syndrome, jaundice, light color stool, liver problems, rectal bleeding, and  rectal pain. Preventive Screening-Counseling & Management  Alcohol-Tobacco     Smoking Status: never    Current Medications (verified): 1)  Metoprolol Tartrate 25 Mg Tabs (Metoprolol Tartrate) .... Take One By Mouth Two Times A Day 2)  Amlodipine Besylate 10 Mg Tabs (Amlodipine Besylate) .... Take One  By Mouth Once Daily 3)  Calcium 500+d 500-400 Mg-Unit Tabs (Calcium Carbonate-Vitamin D) .... Take One By Mouth Three Times A Day 4)  Clonidine Hcl 0.2 Mg Tabs (Clonidine Hcl) .... Take One By Mouth Two Times A Day 5)  Citalopram Hydrobromide 10 Mg Tabs (Citalopram Hydrobromide) .... Take One By Mouth Once Daily 6)  Klor-Con M20 20 Meq Cr-Tabs (Potassium Chloride Crys Cr) .... Take One By Mouth Two Times A Day 7)  Omeprazole 20 Mg Cpdr (Omeprazole) .... Take One By Mouth Once Daily 8)  Pravastatin Sodium 40 Mg Tabs (Pravastatin Sodium) .... Take One By Mouth Once Daily  Allergies (verified): No Known Drug Allergies  Past History:  Past medical, surgical, family and social histories (including risk factors) reviewed for relevance to current acute and chronic problems.  Past Medical History: Reviewed history from 08/01/2010 and no changes required. Current Problems:  PERSONAL HISTORY OF COLONIC POLYPS (ICD-V12.72) HEMORRHOIDS (ICD-455.6) GERD (ICD-530.81) ESOPHAGEAL STRICTURE (ICD-530.3) HIATAL HERNIA (ICD-553.3) ENCOUNTER FOR LONG-TERM USE OF ANTICOAGULANTS (ICD-V58.61) HYPERGLYCEMIA (ICD-790.29) COUGH (ICD-786.2) WEIGHT LOSS, ABNORMAL (ICD-783.21) MEMORY LOSS (ICD-780.93) BACK PAIN, LUMBAR (ICD-724.2) ARTHRALGIA (ICD-719.40) ECZEMA, ATOPIC (ICD-691.8) RENAL DISEASE (ICD-593.9) CONSTIPATION (ICD-564.00) ACUTE VENOUS EMBO & THROMB SUP VEINS UPPER EXT (ICD-453.81) HYPERTENSION (ICD-401.9) DEPRESSION (ICD-311) OBESITY, UNSPECIFIED (ICD-278.00) HYPOKALEMIA (ICD-276.8) HYPERCHOLESTEROLEMIA (ICD-272.0)  Past Surgical History: Reviewed history from 08/01/2010 and no changes required. 1980-tumor removed 1985-blood clots removed  Family History: Reviewed history and no changes required. Family History of Colon Cancer:Brother Family History of Diabetes: Brother  Social History: Reviewed history from 08/01/2010 and no changes required. Occupation: retired Advertising copywriter only  finished 2 grade Widowed lives with her son [name] Patient has never  smoked.  Alcohol Use - no Daily Caffeine Use  Review of Systems       The patient complains of allergy/sinus, cough, swelling of feet/legs, and urine leakage.   General:  Complains of fatigue and weakness; denies fever, chills, sweats, anorexia, malaise, weight loss, and sleep disorder. CV:  Complains of peripheral edema; denies chest pains, angina, palpitations, syncope, dyspnea on exertion, orthopnea, PND, and claudication. Resp:  Complains of cough; denies dyspnea at rest, sputum, wheezing, coughing up blood, and pleurisy. GU:  Complains of urinary incontinence; denies urinary burning, blood in urine, nocturnal urination, urinary frequency, abnormal vaginal bleeding, amenorrhea, menorrhagia, vaginal discharge, pelvic pain, genital sores, painful intercourse, and decreased libido. MS:  Complains of joint pain / LOM, low back pain, and leg pain at night; denies joint swelling, joint stiffness, joint deformity, muscle weakness, muscle cramps, muscle atrophy, leg pain with exertion, and shoulder pain / LOM hand / wrist pain (CTS). Neuro:  Complains of difficulty walking; denies weakness, paralysis, abnormal sensation, seizures, syncope, tremors, vertigo, transient blindness, frequent falls, frequent headaches, headache, sciatica, radiculopathy other:, restless legs, memory loss, and confusion. Psych:  Complains of memory loss; denies depression, anxiety, suicidal ideation, hallucinations, paranoia, phobia, and confusion. Heme:  Denies bruising, bleeding, enlarged lymph nodes, and pagophagia; Coumadin was recently discontinued by primary care..  Vital Signs:  Patient profile:   77 year old female Height:      61 inches Weight:      182.38 pounds BMI:     34.58 Pulse rate:   60 / minute Pulse rhythm:   regular BP sitting:   138 / 70  (left arm) Cuff size:   regular  Vitals Entered By: June McMurray CMA Duncan Dull) (August 08, 2010 1:32 PM)  Physical Exam  General:  Well developed, well nourished, no acute distress.healthy appearing.   Head:  Normocephalic and atraumatic. Eyes:  PERRLA, no icterus.exam deferred to patient's ophthalmologist.   Lungs:  Clear throughout to auscultation. Heart:  Regular rate and rhythm; no murmurs, rubs,  or bruits. Abdomen:  Soft, nontender and nondistended. No masses, hepatosplenomegaly or hernias noted. Normal bowel sounds.Scarred and deformed lower abdominal area from previous surgery. Rectal:  deferred until time of colonoscopy.   Msk:  Symmetrical with no gross deformities. Normal posture.decreased ROM.   Extremities:  1+ pedal edema.  left leg greater than right. Both legs are tender to touch he has stasis dermatitis. Neurologic:  Alert and  oriented x4;  grossly normal neurologically.confused.   Psych:  Alert and cooperative. Normal mood and affect.poor concentration.     Impression & Recommendations:  Problem # 1:  PERSONAL HISTORY OF COLONIC POLYPS (ICD-V12.72) Assessment Unchanged  Followup colonoscopy with balanced electrolyte preparation. I will repeat her labs including CBC and anemia profile. Review of her record shows a recent negative Hemoccult card but she has a very strong family history of colon cancer and had a previous right cecal polyp removed. Pathology results showed that no specimen was really recovered.  Orders: TLB-CBC Platelet - w/Differential (85025-CBCD) TLB-BMP (Basic Metabolic Panel-BMET) (80048-METABOL) TLB-Hepatic/Liver Function Pnl (80076-HEPATIC) TLB-TSH (Thyroid Stimulating Hormone) (84443-TSH) TLB-B12, Serum-Total ONLY (16109-U04) TLB-Ferritin (82728-FER) TLB-Folic Acid (Folate) (82746-FOL) TLB-IBC Pnl (Iron/FE;Transferrin) (83550-IBC)  Problem # 2:  DVT (ICD-453.40) Assessment: Unchanged  Chronic edema and tenderness of her lower extremities with impaired mobilization. I did not start diuretics but will seek primary care  evaluation first. She does have some mild chronic renal insufficiency.  Orders: TLB-CBC Platelet - w/Differential (85025-CBCD) TLB-BMP (Basic  Metabolic Panel-BMET) (80048-METABOL) TLB-Hepatic/Liver Function Pnl (80076-HEPATIC) TLB-TSH (Thyroid Stimulating Hormone) (84443-TSH) TLB-B12, Serum-Total ONLY (16109-U04) TLB-Ferritin (82728-FER) TLB-Folic Acid (Folate) (82746-FOL) TLB-IBC Pnl (Iron/FE;Transferrin) (83550-IBC)  Problem # 3:  GERD (ICD-530.81) Assessment: Improved continue omeprazole therapy.  Problem # 4:  WEIGHT LOSS, ABNORMAL (ICD-783.21) Assessment: Unchanged  Problem # 5:  RENAL DISEASE (ICD-593.9) Assessment: Unchanged hypercalcemia probably associated  CRF from hypertensive cardiovascular disease.   Problem # 6:  CONSTIPATION (ICD-564.00) Assessment: Deteriorated trial of MiraLax q.h.s.  Patient Instructions: 1)  Please go to the basement today for your labs.  2)  Your prescription has been sent to you pharmacy.  3)  We have referred you to a new Primary Care, Dr. Sanda Linger on the first floor on 08/03/2010 arrive at 1:00pm. 4)  We have scheduled your colonoscopy please follow the seperate instructions.  5)  Start Miralax one capful in 8 ounces of water everynight.  6)  The medication list was reviewed and reconciled.  All changed / newly prescribed medications were explained.  A complete medication list was provided to the patient / caregiver. Prescriptions: MOVIPREP 100 GM  SOLR (PEG-KCL-NACL-NASULF-NA ASC-C) As per prep instructions.  #1 x 0   Entered by:   Harlow Mares CMA (AAMA)   Authorized by:   Mardella Layman MD [facility]   Signed by:   Mardella Layman MD [facility] on 08/08/2010   Method used:   Electronically to        [facility] (retail)       [address]       [place], [place]  [address]       Ph: [phone]       Fax: [phone]   RxID:   [id number]   Appended Document: Orders Update    Clinical  Lists Changes  Orders: Added new Test order of Colonoscopy (Colon) - Signed Added new Referral order of Primary Care Referral (Primary) - Signed

## 2011-01-04 NOTE — Progress Notes (Signed)
  Phone Note Refill Request Message from:  Patient on October 31, 2010 2:01 PM  Refills Requested: Medication #1:  METOPROLOL TARTRATE 25 MG TABS take one by mouth two times a day Initial call taken by: Rock Nephew CMA,  October 31, 2010 2:01 PM    Prescriptions: METOPROLOL TARTRATE 25 MG TABS (METOPROLOL TARTRATE) take one by mouth two times a day  #60 x 5   Entered by:   Rock Nephew CMA   Authorized by:   Etta Grandchild MD   Signed by:   Rock Nephew CMA on 10/31/2010   Method used:   Electronically to        [facility] (retail)       [address]       [place], [place]  [address]       Ph: [phone]       Fax: [phone]   RxID:   [id number]

## 2011-01-04 NOTE — Progress Notes (Signed)
Summary: MED ?  Phone Note Call from Patient   Summary of Call: Pt left a mess, said she was previously on norvasc qd, metoprolol bid, clonidine two times a day and losartan once daily. Patient wants to know if she should continue all this medication.  Initial call taken by: Lamar Sprinkles, CMA,  September 21, 2010 11:38 AM  Follow-up for Phone Call        i think norvasc was stopped due to edema Follow-up by: Etta Grandchild MD,  September 21, 2010 11:45 AM  Additional Follow-up for Phone Call Additional follow up Details #1::        Pt informed  Additional Follow-up by: Lamar Sprinkles, CMA,  September 21, 2010 5:06 PM

## 2011-01-04 NOTE — Progress Notes (Signed)
  Phone Note Call from Patient   Caller: Patient Summary of Call: pt called to find out what medications she was told to stop taking again.  Initial call taken by: Alysia Penna,  September 22, 2010 1:21 PM  Follow-up for Phone Call        called pt back and advised that it was the Norvasc that she is to stop taking.  Follow-up by: Alysia Penna,  September 22, 2010 1:22 PM

## 2011-01-04 NOTE — Progress Notes (Signed)
  Phone Note Call from Patient   Summary of Call: Patient is requesting a call regarding her medications.  Initial call taken by: Lamar Sprinkles, CMA,  November 02, 2010 12:25 PM  Follow-up for Phone Call        pt has question on all her medications. I advised pt that she needs an office visit to discuss this with the Doctor. Pt states she would arrange for transportation and call monday morning to sch an appt Follow-up by: Ami Bullins CMA,  November 03, 2010 4:51 PM

## 2011-01-04 NOTE — Miscellaneous (Signed)
Summary: [facility]   Imported By: Lennie Odor 08/11/2010 12:24:05  _____________________________________________________________________  External Attachment:    Type:   Image     Comment:   External Document

## 2011-01-04 NOTE — Progress Notes (Signed)
  Phone Note Call from Patient Call back at Home Phone [phone] Call back at [phone]   Caller: Patient Summary of Call: Pt requesting lab results from a week ago. Initial call taken by: Verdell Face,  August 24, 2010 1:25 PM  Follow-up for Phone Call        Dr. Yetta Barre is out of office today. Pt is calling for results 08/17/10. Pls advise Follow-up by: Orlan Leavens RMA,  August 24, 2010 3:17 PM  Additional Follow-up for Phone Call Additional follow up Details #1::        Patient had Protime that was subtherapeutic and should be addressed by the coag clinic. she had a positive D.-dimer.  call to patient: told D-dimer was elevated. her last INR was 1.6 9/19. She is advised to go to coag clinic. Talked with Dr. Tenny Craw (DOD) - if pt subtherapeutic she will need to be covered with lovenox.  Additional Follow-up by: Jacques Navy MD,  August 25, 2010 1:43 PM

## 2011-01-04 NOTE — Letter (Signed)
Summary: Patient Notice- Polyp Results  [facility]  [address] [place], [place] [address]   Phone: [phone]  Fax: [phone]        August 18, 2010 MRN: [id number]    [facility] [address] [place], [place]  [address]    Dear [name],  I am pleased to inform you that the colon polyp(s) removed during your recent colonoscopy was (were) found to be benign (no cancer detected) upon pathologic examination.  I recommend you have a repeat colonoscopy examination in [AS NEEDED]_ years to look for recurrent polyps, as having colon polyps increases your risk for having recurrent polyps or even colon cancer in the future.  Should you develop new or worsening symptoms of abdominal pain, bowel habit changes or bleeding from the rectum or bowels, please schedule an evaluation with either your primary care physician or with me.  Additional information/recommendations:  _X_ No further action with gastroenterology is needed at this time. Please      follow-up with your primary care physician for your other healthcare      needs.  __ Please call [phone] to schedule a return visit to review your      situation.  __ Please keep your follow-up visit as already scheduled.  __ Continue treatment plan as outlined the day of your exam.  Please call us if you are having persistent problems or have questions about your condition that have not been fully answered at this time.  Sincerely,  Mardella Layman MD [facility]  This letter has been electronically signed by your physician.  Appended Document: Patient Notice- Polyp Results letter mailed

## 2011-01-04 NOTE — Progress Notes (Signed)
Summary: Med ?  Phone Note Call from Patient   Summary of Call: pt called requesting  med for leg pain. Also, she is questioning whether she is to cont current Bp med with the one we gave her yest.  Please advise  Initial call taken by: Lanier Prude, [facility]),  August 18, 2010 3:08 PM  Follow-up for Phone Call        yes, stay on current meds but add the new one for better BP control and to decrease edema Follow-up by: Etta Grandchild MD,  August 18, 2010 3:16 PM  Additional Follow-up for Phone Call Additional follow up Details #1::        pt informed  Additional Follow-up by: Lanier Prude, [facility]),  August 18, 2010 5:23 PM    New/Updated Medications: TRAMADOL HCL 50 MG TABS (TRAMADOL HCL) One by mouth QID as needed for pain Prescriptions: TRAMADOL HCL 50 MG TABS (TRAMADOL HCL) One by mouth QID as needed for pain  #100 x 3   Entered and Authorized by:   Etta Grandchild MD   Signed by:   Etta Grandchild MD on 08/18/2010   Method used:   Electronically to        [facility] (retail)       [address]       [place], [place]  [address]       Ph: [phone]       Fax: [phone]   RxID:   [id number]

## 2011-01-04 NOTE — Miscellaneous (Signed)
Summary: Plan/[facility]  Plan/[facility]   Imported By: Lester [place] 12/06/2010 08:19:09  _____________________________________________________________________  External Attachment:    Type:   Image     Comment:   External Document

## 2011-01-04 NOTE — Procedures (Signed)
Summary: Colonoscopy   Colonoscopy  Procedure date:  10/16/2004  Findings:      Location:  [facility].    Procedures Next Due Date:    Colonoscopy: 10/2007 Patient Name: Annette Mckay, Annette Mckay MRN:  Procedure Procedures: Colonoscopy CPT: [address].    with polypectomy. CPT: [id number].  Personnel: Endoscopist: Vania Rea. Jarold Motto, MD.  Exam Location: Exam performed in Outpatient Clinic. Outpatient  Patient Consent: Procedure, Alternatives, Risks and Benefits discussed, consent obtained, from patient. Consent was obtained by the RN.  Indications  Average Risk Screening Routine.  History  Current Medications: Patient is currently taking Coumadin.  Pre-Exam Physical: Performed Oct 16, 2004. Cardio-pulmonary exam, Rectal exam, Abdominal exam, Extremity exam, Mental status exam WNL.  Exam Exam: Extent of exam reached: Cecum, extent intended: Cecum.  The cecum was identified by appendiceal orifice and IC valve. Patient position: on left side. Duration of exam: 20 minutes. Colon retroflexion performed. Images taken. ASA Classification: II. Tolerance: excellent.  Monitoring: Pulse and BP monitoring, Oximetry used. Supplemental O2 given. at 2 Liters.  Colon Prep Used Golytely for colon prep. Prep results: good.  Sedation Meds: Patient assessed and found to be appropriate for moderate (conscious) sedation. Fentanyl 50 mcg. given IV. Versed 4 mg. given IV.  Instrument(s): CF 140L. Serial [id number].  Findings - POLYP: Transverse Colon, diminutive, Procedure:  snare with cautery, removed, not retrieved,  - POLYP: Cecum, Maximum size: 7 mm. sessile polyp. Procedure:  snare with cautery, removed, retrieved, Polyp sent to pathology. ICD9: Colon Polyps: 211.3.  - NORMAL EXAM: Cecum to Rectum. Not Seen: Polyps. AVM's. Colitis. Tumors. Crohn's. Diverticulosis.  - HEMORRHOIDS: External. Size: Medium. Not bleeding. Not thrombosed. ICD9: Hemorrhoids, External: 455.3.    Assessment  Diagnoses: 211.3: Colon Polyps.  455.3: Hemorrhoids, External.   Events  Unplanned Interventions: No intervention was required.  Plans  Post Exam Instructions: No aspirin or non-steroidal containing medications: 2 weeks.  Medication Plan: Referring Elnora Quizon to order medications.  Patient Education: Patient given standard instructions for: Polyps. Patient instructed to get routine colonoscopy every 3 years.  Comments: Hold coumadin for 1 week. Disposition: After procedure patient sent to recovery. After recovery patient sent home.  Scheduling/Referral: Follow-Up prn. Await pathology to schedule patient.    CC: Reuben Likes, MD  This report was created from the original endoscopy report, which was reviewed and signed by the above listed endoscopist.

## 2011-01-04 NOTE — Assessment & Plan Note (Signed)
Summary: FU Natale Milch #   Vital Signs:  Patient profile:   77 year old female Menstrual status:  postmenopausal Height:      61 inches Weight:      188 pounds BMI:     35.65 O2 Sat:      96 % on Room air Temp:     98.3 degrees F oral Pulse rate:   59 / minute Pulse rhythm:   regular Resp:     18 per minute BP sitting:   140 / 88  (left arm) Cuff size:   regular  Vitals Entered By: Alysia Penna (September 20, 2010 2:35 PM)  Nutrition Counseling: Patient's BMI is greater than 25 and therefore counseled on weight management options.  O2 Flow:  Room air CC: pt here for follow up visit. /cp sma Comments pt no longer taking keflex or tylenol with codeine.     Menstrual Status postmenopausal   Primary Care Provider:  Etta Grandchild MD  CC:  pt here for follow up visit. /cp sma.  History of Present Illness:  Follow-Up Visit      This is an 77 year old woman who presents for Follow-up visit.  The patient complains of edema, but denies chest pain, palpitations, dizziness, syncope, SOB, DOE, PND, and orthopnea.  Since the last visit the patient notes no new problems or concerns.  The patient reports taking meds as prescribed, monitoring BP, and dietary compliance.  When questioned about possible medication side effects, the patient notes none.    Preventive Screening-Counseling & Management  Alcohol-Tobacco     Alcohol drinks/day: 0     Alcohol Counseling: not indicated; patient does not drink     Smoking Status: never     Tobacco Counseling: not indicated; no tobacco use  Hep-HIV-STD-Contraception     Hepatitis Risk: no risk noted     HIV Risk: no risk noted     STD Risk: no risk noted  Clinical Review Panels:  Prevention   Last Colonoscopy:  DONE (08/16/2010)  Immunizations   Last Tetanus Booster:  Tdap (09/20/2010)   Last Flu Vaccine:  Fluvax 3+ (09/20/2010)  Diabetes Management   Creatinine:  1.2 (08/08/2010)   Last Flu Vaccine:  Fluvax 3+ (09/20/2010)  CBC  WBC:  3.5 (08/08/2010)   RBC:  3.40 (08/08/2010)   Hgb:  12.2 (08/08/2010)   Hct:  34.9 (08/08/2010)   Platelets:  159.0 (08/08/2010)   MCV  102.8 (08/08/2010)   MCHC  35.0 (08/08/2010)   RDW  12.8 (08/08/2010)   PMN:  60.0 (08/08/2010)   Lymphs:  27.3 (08/08/2010)   Monos:  9.5 (08/08/2010)   Eosinophils:  2.6 (08/08/2010)   Basophil:  0.6 (08/08/2010)  Complete Metabolic Panel   Glucose:  83 (08/08/2010)   Sodium:  141 (08/08/2010)   Potassium:  4.6 (08/08/2010)   Chloride:  106 (08/08/2010)   CO2:  28 (08/08/2010)   BUN:  28 (08/08/2010)   Creatinine:  1.2 (08/08/2010)   Albumin:  3.9 (08/08/2010)   Total Protein:  7.4 (08/08/2010)   Calcium:  10.3 (08/08/2010)   Total Bili:  0.6 (08/08/2010)   Alk Phos:  65 (08/08/2010)   SGPT (ALT):  23 (08/08/2010)   SGOT (AST):  36 (08/08/2010)   Medications Prior to Update: 1)  Metoprolol Tartrate 25 Mg Tabs (Metoprolol Tartrate) .... Take One By Mouth Two Times A Day 2)  Calcium 500+d 500-400 Mg-Unit Tabs (Calcium Carbonate-Vitamin D) .... Take One By Mouth Three Times A Day  3)  Clonidine Hcl 0.2 Mg Tabs (Clonidine Hcl) .... Take One By Mouth Two Times A Day 4)  Citalopram Hydrobromide 10 Mg Tabs (Citalopram Hydrobromide) .... Take One By Mouth Once Daily 5)  Klor-Con M20 20 Meq Cr-Tabs (Potassium Chloride Crys Cr) .... Take One By Mouth Two Times A Day 6)  Omeprazole 20 Mg Cpdr (Omeprazole) .... Take One By Mouth Once Daily 7)  Pravastatin Sodium 40 Mg Tabs (Pravastatin Sodium) .... Take One By Mouth Once Daily 8)  Miralax  Powd (Polyethylene Glycol 3350) .... Use One Capfull in 8 Ounces of Water and Drink Nightly 9)  Keflex 500 Mg Caps (Cephalexin) .... 2 Once Daily 10)  Tylenol With Codeine #3 300-30 Mg Tabs (Acetaminophen-Codeine) .Marland Kitchen.. 1 At Bedtime 11)  Coumadin 5 Mg Tabs (Warfarin Sodium) .... One By Mouth Once Daily 12)  Losartan Potassium-Hctz 50-12.5 Mg Tabs (Losartan Potassium-Hctz) .... One By Mouth Once Daily For High  Blood Pressure 13)  Tramadol Hcl 50 Mg Tabs (Tramadol Hcl) .... One By Mouth Qid As Needed For Pain  Current Medications (verified): 1)  Metoprolol Tartrate 25 Mg Tabs (Metoprolol Tartrate) .... Take One By Mouth Two Times A Day 2)  Calcium 500+d 500-400 Mg-Unit Tabs (Calcium Carbonate-Vitamin D) .... Take One By Mouth Three Times A Day 3)  Clonidine Hcl 0.2 Mg Tabs (Clonidine Hcl) .... Take One By Mouth Two Times A Day 4)  Citalopram Hydrobromide 10 Mg Tabs (Citalopram Hydrobromide) .... Take One By Mouth Once Daily 5)  Klor-Con M20 20 Meq Cr-Tabs (Potassium Chloride Crys Cr) .... Take One By Mouth Two Times A Day 6)  Omeprazole 20 Mg Cpdr (Omeprazole) .... Take One By Mouth Once Daily 7)  Pravastatin Sodium 40 Mg Tabs (Pravastatin Sodium) .... Take One By Mouth Once Daily 8)  Miralax  Powd (Polyethylene Glycol 3350) .... Use One Capfull in 8 Ounces of Water and Drink Nightly 9)  Keflex 500 Mg Caps (Cephalexin) .... 2 Once Daily 10)  Tylenol With Codeine #3 300-30 Mg Tabs (Acetaminophen-Codeine) .Marland Kitchen.. 1 At Bedtime 11)  Coumadin 5 Mg Tabs (Warfarin Sodium) .... One By Mouth Once Daily 12)  Losartan Potassium-Hctz 50-12.5 Mg Tabs (Losartan Potassium-Hctz) .... One By Mouth Once Daily For High Blood Pressure 13)  Tramadol Hcl 50 Mg Tabs (Tramadol Hcl) .... One By Mouth Qid As Needed For Pain  Allergies (verified): 1)  ! Amlodipine Besylate  Past History:  Past Medical History: Last updated: 08/01/2010 Current Problems:  PERSONAL HISTORY OF COLONIC POLYPS (ICD-V12.72) HEMORRHOIDS (ICD-455.6) GERD (ICD-530.81) ESOPHAGEAL STRICTURE (ICD-530.3) HIATAL HERNIA (ICD-553.3) ENCOUNTER FOR LONG-TERM USE OF ANTICOAGULANTS (ICD-V58.61) HYPERGLYCEMIA (ICD-790.29) COUGH (ICD-786.2) WEIGHT LOSS, ABNORMAL (ICD-783.21) MEMORY LOSS (ICD-780.93) BACK PAIN, LUMBAR (ICD-724.2) ARTHRALGIA (ICD-719.40) ECZEMA, ATOPIC (ICD-691.8) RENAL DISEASE (ICD-593.9) CONSTIPATION (ICD-564.00) ACUTE VENOUS EMBO &  THROMB SUP VEINS UPPER EXT (ICD-453.81) HYPERTENSION (ICD-401.9) DEPRESSION (ICD-311) OBESITY, UNSPECIFIED (ICD-278.00) HYPOKALEMIA (ICD-276.8) HYPERCHOLESTEROLEMIA (ICD-272.0)  Past Surgical History: Last updated: 08/01/2010 1980-tumor removed 1985-blood clots removed  Family History: Last updated: 08/08/2010 Family History of Colon Cancer:Brother Family History of Diabetes: Brother  Social History: Last updated: 08/01/2010 Occupation: retired Advertising copywriter only finished 2 grade Widowed lives with her son [name] Patient has never smoked.  Alcohol Use - no Daily Caffeine Use  Risk Factors: Alcohol Use: 0 (09/20/2010)  Risk Factors: Smoking Status: never (09/20/2010)  Family History: Reviewed history from 08/08/2010 and no changes required. Family History of Colon Cancer:Brother Family History of Diabetes: Brother  Social History: Reviewed history from 08/01/2010 and no changes required. Occupation: retired  housekeeper only finished 2 grade Widowed lives with her son [name] Patient has never smoked.  Alcohol Use - no Daily Caffeine Use Hepatitis Risk:  no risk noted HIV Risk:  no risk noted STD Risk:  no risk noted  Review of Systems       The patient complains of peripheral edema.  The patient denies anorexia, fever, weight loss, weight gain, chest pain, syncope, dyspnea on exertion, prolonged cough, headaches, hemoptysis, abdominal pain, suspicious skin lesions, abnormal bleeding, and angioedema.   Resp:  Denies chest discomfort, chest pain with inspiration, cough, coughing up blood, pleuritic, shortness of breath, sputum productive, and wheezing. Heme:  Denies abnormal bruising, bleeding, enlarge lymph nodes, fevers, pallor, and skin discoloration.  Physical Exam  General:  alert, well-developed, well-nourished, well-hydrated, appropriate dress, normal appearance, healthy-appearing, cooperative to examination, and overweight-appearing.   Mouth:  Oral mucosa  and oropharynx without lesions or exudates.  Teeth in good repair. Neck:  supple, full ROM, no masses, no thyromegaly, no JVD, normal carotid upstroke, no carotid bruits, no cervical lymphadenopathy, and no neck tenderness.   Lungs:  normal respiratory effort, no intercostal retractions, no accessory muscle use, normal breath sounds, no dullness, no fremitus, no crackles, and no wheezes.   Heart:  normal rate, regular rhythm, no murmur, no gallop, no rub, and no JVD.   Abdomen:  soft, non-tender, normal bowel sounds, no distention, no masses, no guarding, no rigidity, no rebound tenderness, no abdominal hernia, no inguinal hernia, no hepatomegaly, and no splenomegaly.   Msk:  normal ROM, no joint tenderness, no joint swelling, no joint warmth, no redness over joints, no joint deformities, no joint instability, no crepitation, and no muscle atrophy.   Pulses:  R radial normal, L radial normal, R femoral decreased, R popliteal decreased, R posterior tibial decreased, R dorsalis pedis decreased, L femoral decreased, L popliteal decreased, L posterior tibial decreased, and L dorsalis pedis decreased.   Extremities:  2+ left pedal edema with brawny induration and hyperpigmentation and 1+ right pedal edema.   Neurologic:  alert & oriented X3, cranial nerves II-XII intact, strength normal in all extremities, sensation intact to light touch, sensation intact to pinprick, gait normal, and DTRs symmetrical and normal.   Skin:  turgor normal, color normal, no rashes, no suspicious lesions, no ecchymoses, no petechiae, no purpura, no ulcerations, and no edema.   Cervical Nodes:  no anterior cervical adenopathy and no posterior cervical adenopathy.   Psych:  normally interactive, good eye contact, not anxious appearing, not depressed appearing, easily distracted, poor concentration, and memory impairment.     Impression & Recommendations:  Problem # 1:  EDEMA (ICD-782.3) Assessment Unchanged  Her updated  medication list for this problem includes:    Losartan Potassium-hctz 50-12.5 Mg Tabs (Losartan potassium-hctz) ..... One by mouth once daily for high blood pressure  Problem # 2:  DVT (ICD-453.40) Assessment: Improved  Problem # 3:  HYPERTENSION (ICD-401.9) Assessment: Unchanged  Her updated medication list for this problem includes:    Metoprolol Tartrate 25 Mg Tabs (Metoprolol tartrate) .Marland Kitchen... Take one by mouth two times a day    Clonidine Hcl 0.2 Mg Tabs (Clonidine hcl) .Marland Kitchen... Take one by mouth two times a day    Losartan Potassium-hctz 50-12.5 Mg Tabs (Losartan potassium-hctz) ..... One by mouth once daily for high blood pressure  BP today: 140/88 Prior BP: 160/80 (08/17/2010)  Labs Reviewed: K+: 4.6 (08/08/2010) Creat: : 1.2 (08/08/2010)     Complete Medication List: 1)  Metoprolol Tartrate  25 Mg Tabs (Metoprolol tartrate) .... Take one by mouth two times a day 2)  Calcium 500+d 500-400 Mg-unit Tabs (Calcium carbonate-vitamin d) .... Take one by mouth three times a day 3)  Clonidine Hcl 0.2 Mg Tabs (Clonidine hcl) .... Take one by mouth two times a day 4)  Citalopram Hydrobromide 10 Mg Tabs (Citalopram hydrobromide) .... Take one by mouth once daily 5)  Klor-con M20 20 Meq Cr-tabs (Potassium chloride crys cr) .... Take one by mouth two times a day 6)  Omeprazole 20 Mg Cpdr (Omeprazole) .... Take one by mouth once daily 7)  Pravastatin Sodium 40 Mg Tabs (Pravastatin sodium) .... Take one by mouth once daily 8)  Miralax Powd (Polyethylene glycol 3350) .... Use one capfull in 8 ounces of water and drink nightly 9)  Keflex 500 Mg Caps (Cephalexin) .... 2 once daily 10)  Tylenol With Codeine #3 300-30 Mg Tabs (Acetaminophen-codeine) .Marland Kitchen.. 1 at bedtime 11)  Coumadin 5 Mg Tabs (Warfarin sodium) .... One by mouth once daily 12)  Losartan Potassium-hctz 50-12.5 Mg Tabs (Losartan potassium-hctz) .... One by mouth once daily for high blood pressure 13)  Tramadol Hcl 50 Mg Tabs (Tramadol  hcl) .... One by mouth qid as needed for pain  Other Orders: Radiology Referral (Radiology) Tdap => 33yrs IM ([address]) Admin 1st Vaccine (60454) Flu Vaccine 19yrs + MEDICARE PATIENTS (U9811) Administration Flu vaccine - MCR (B1478)  Patient Instructions: 1)  Please schedule a follow-up appointment in 4 months. 2)  Check your Blood Pressure regularly. If it is above 140/80: you should make an appointment.   Orders Added: 1)  Radiology Referral [Radiology] 2)  Tdap => 20yrs IM [90715] 3)  Admin 1st Vaccine [90471] 4)  Flu Vaccine 31yrs + MEDICARE PATIENTS [Q2039] 5)  Administration Flu vaccine - MCR [G0008] 6)  Est. Patient Level III [29562]   Immunization History:  Influenza Immunization History:    Influenza:  fluvax mcr (09/07/2010)  Immunizations Administered:  Tetanus Vaccine:    Vaccine Type: Tdap    Site: right deltoid    Mfr: GlaxoSmithKline    Dose: 0.5 ml    Route: IM    Given by: Alysia Penna    Exp. Date: 09/21/2012    Lot #: [id number]    VIS given: 10/20/08 version given September 20, 2010.   Immunization History:  Influenza Immunization History:    Influenza:  Fluvax MCR (09/07/2010)  Immunizations Administered:  Tetanus Vaccine:    Vaccine Type: Tdap    Site: right deltoid    Mfr: GlaxoSmithKline    Dose: 0.5 ml    Route: IM    Given by: Alysia Penna    Exp. Date: 09/21/2012    Lot #: [id number]    VIS given: 10/20/08 version given September 20, 2010.   Flu Vaccine Consent Questions     Do you have a history of severe allergic reactions to this vaccine? no    Any prior history of allergic reactions to egg and/or gelatin? no    Do you have a sensitivity to the preservative Thimersol? no    Do you have a past history of Guillan-Barre Syndrome? no    Do you currently have an acute febrile illness? no    Have you ever had a severe reaction to latex? no    Vaccine information given and explained to patient? yes    Are you currently pregnant?  no    Lot Number:[id number]   Exp Date:06/02/2011   Site Given  Left  Deltoid [id number]

## 2011-01-04 NOTE — Progress Notes (Signed)
  Phone Note Refill Request Message from:  Fax from Pharmacy on November 24, 2010 11:11 AM  Refills Requested: Medication #1:  PRAVASTATIN SODIUM 40 MG TABS take one by mouth once daily   Last Refilled: 10/26/2010  Medication #2:  CITALOPRAM HYDROBROMIDE 10 MG TABS take one by mouth once daily   Last Refilled: 10/23/2010  Medication #3:  FUROSEMIDE 20 MG TABS Take 1 tablet by mouth once a day   Last Refilled: 10/23/2010 rx last filled br DR Marijo Conception  Initial call taken by: Rock Nephew CMA,  November 24, 2010 11:12 AM    Prescriptions: FUROSEMIDE 20 MG TABS (FUROSEMIDE) Take 1 tablet by mouth once a day  #30 x 6   Entered by:   Rock Nephew CMA   Authorized by:   Etta Grandchild MD   Signed by:   Rock Nephew CMA on 11/24/2010   Method used:   Electronically to        [facility] (retail)       [address]       [place], [place]  [address]       Ph: [phone]       Fax: [phone]   RxID:   [id number] PRAVASTATIN SODIUM 40 MG TABS (PRAVASTATIN SODIUM) take one by mouth once daily  #30 x 6   Entered by:   Rock Nephew CMA   Authorized by:   Etta Grandchild MD   Signed by:   Rock Nephew CMA on 11/24/2010   Method used:   Electronically to        [facility] (retail)       [address]       [place], [place]  [address]       Ph: [phone]       Fax: [phone]   RxID:   [id number] CITALOPRAM HYDROBROMIDE 10 MG TABS (CITALOPRAM HYDROBROMIDE) take one by mouth once daily  #30 x 6   Entered by:   Rock Nephew CMA   Authorized by:   Etta Grandchild MD   Signed by:   Rock Nephew CMA on 11/24/2010   Method used:   Electronically to        [facility] (retail)       [address]       [place], [place]  [address]       Ph: [phone]       Fax: [phone]   RxID:   [id number]

## 2011-01-04 NOTE — Assessment & Plan Note (Signed)
Summary: discuss meds/ nws   Vital Signs:  Patient profile:   77 year old female Menstrual status:  postmenopausal Height:      61 inches Weight:      184 pounds BMI:     34.89 O2 Sat:      96 % on Room air Temp:     98.2 degrees F oral Pulse rate:   72 / minute Pulse rhythm:   regular Resp:     16 per minute BP sitting:   136 / 72  (left arm) Cuff size:   large  Vitals Entered By: Annette Mckay CMA (November 09, 2010 2:38 PM)  Nutrition Counseling: Patient's BMI is greater than 25 and therefore counseled on weight management options.  O2 Flow:  Room air  Primary Care Provider:  Etta Grandchild MD   History of Present Illness:   She brings her meds with her today and it appears that she may be taking overlapping meds as she has pre-set med boxes from me and Dr. Chilton Si and Dr. Marijo Mckay. She thinks she is taking all of the meds and she says that a woman and her son help her with her meds.  Follow-Up Visit      This is an 77 year old woman who presents for Follow-up visit.  The patient denies chest pain, palpitations, dizziness, syncope, edema, SOB, DOE, PND, and orthopnea.  Since the last visit the patient notes no new problems or concerns.  The patient reports taking meds as prescribed, not monitoring BP, not monitoring blood sugars, and dietary compliance.  When questioned about possible medication side effects, the patient notes none.    Preventive Screening-Counseling & Management  Alcohol-Tobacco     Alcohol drinks/day: 0     Alcohol Counseling: not indicated; patient does not drink     Smoking Status: never     Tobacco Counseling: not indicated; no tobacco use  Hep-HIV-STD-Contraception     Hepatitis Risk: no risk noted     HIV Risk: no risk noted     STD Risk: no risk noted  Clinical Review Panels:  Prevention   Last Mammogram:  Normal Bilateral (10/17/2010)   Last Colonoscopy:  DONE (08/16/2010)  Immunizations   Last Tetanus Booster:  Tdap (09/20/2010)  Last Flu Vaccine:  Fluvax 3+ (09/20/2010)  Diabetes Management   Creatinine:  1.2 (08/08/2010)   Last Flu Vaccine:  Fluvax 3+ (09/20/2010)  CBC   WBC:  3.5 (08/08/2010)   RBC:  3.40 (08/08/2010)   Hgb:  12.2 (08/08/2010)   Hct:  34.9 (08/08/2010)   Platelets:  159.0 (08/08/2010)   MCV  102.8 (08/08/2010)   MCHC  35.0 (08/08/2010)   RDW  12.8 (08/08/2010)   PMN:  60.0 (08/08/2010)   Lymphs:  27.3 (08/08/2010)   Monos:  9.5 (08/08/2010)   Eosinophils:  2.6 (08/08/2010)   Basophil:  0.6 (08/08/2010)  Complete Metabolic Panel   Glucose:  83 (08/08/2010)   Sodium:  141 (08/08/2010)   Potassium:  4.6 (08/08/2010)   Chloride:  106 (08/08/2010)   CO2:  28 (08/08/2010)   BUN:  28 (08/08/2010)   Creatinine:  1.2 (08/08/2010)   Albumin:  3.9 (08/08/2010)   Total Protein:  7.4 (08/08/2010)   Calcium:  10.3 (08/08/2010)   Total Bili:  0.6 (08/08/2010)   Alk Phos:  65 (08/08/2010)   SGPT (ALT):  23 (08/08/2010)   SGOT (AST):  36 (08/08/2010)   Medications Prior to Update: 1)  Metoprolol Tartrate 25 Mg  Tabs (Metoprolol Tartrate) .... Take One By Mouth Two Times A Day 2)  Calcium 500+d 500-400 Mg-Unit Tabs (Calcium Carbonate-Vitamin D) .... Take One By Mouth Three Times A Day 3)  Clonidine Hcl 0.2 Mg Tabs (Clonidine Hcl) .... Take One By Mouth Two Times A Day 4)  Citalopram Hydrobromide 10 Mg Tabs (Citalopram Hydrobromide) .... Take One By Mouth Once Daily 5)  Klor-Con M20 20 Meq Cr-Tabs (Potassium Chloride Crys Cr) .... Take One By Mouth Two Times A Day 6)  Omeprazole 20 Mg Cpdr (Omeprazole) .... Take One By Mouth Once Daily 7)  Pravastatin Sodium 40 Mg Tabs (Pravastatin Sodium) .... Take One By Mouth Once Daily 8)  Miralax  Powd (Polyethylene Glycol 3350) .... Use One Capfull in 8 Ounces of Water and Drink Nightly 9)  Keflex 500 Mg Caps (Cephalexin) .... 2 Once Daily 10)  Tylenol With Codeine #3 300-30 Mg Tabs (Acetaminophen-Codeine) .Marland Kitchen.. 1 At Bedtime 11)  Coumadin 5 Mg Tabs  (Warfarin Sodium) .... One By Mouth Once Daily 12)  Losartan Potassium-Hctz 50-12.5 Mg Tabs (Losartan Potassium-Hctz) .... One By Mouth Once Daily For High Blood Pressure 13)  Tramadol Hcl 50 Mg Tabs (Tramadol Hcl) .... One By Mouth Qid As Needed For Pain  Current Medications (verified): 1)  Metoprolol Tartrate 25 Mg Tabs (Metoprolol Tartrate) .... Take One By Mouth Two Times A Day 2)  Calcium 500+d 500-400 Mg-Unit Tabs (Calcium Carbonate-Vitamin D) .... Take One By Mouth Three Times A Day 3)  Clonidine Hcl 0.2 Mg Tabs (Clonidine Hcl) .... Take One By Mouth Two Times A Day 4)  Citalopram Hydrobromide 10 Mg Tabs (Citalopram Hydrobromide) .... Take One By Mouth Once Daily 5)  Klor-Con M20 20 Meq Cr-Tabs (Potassium Chloride Crys Cr) .... Take One By Mouth Two Times A Day 6)  Omeprazole 20 Mg Cpdr (Omeprazole) .... Take One By Mouth Once Daily 7)  Pravastatin Sodium 40 Mg Tabs (Pravastatin Sodium) .... Take One By Mouth Once Daily 8)  Miralax  Powd (Polyethylene Glycol 3350) .... Use One Capfull in 8 Ounces of Water and Drink Nightly 9)  Keflex 500 Mg Caps (Cephalexin) .... 2 Once Daily 10)  Tylenol With Codeine #3 300-30 Mg Tabs (Acetaminophen-Codeine) .Marland Kitchen.. 1 At Bedtime 11)  Coumadin 5 Mg Tabs (Warfarin Sodium) .... One By Mouth Once Daily 12)  Losartan Potassium-Hctz 50-12.5 Mg Tabs (Losartan Potassium-Hctz) .... One By Mouth Once Daily For High Blood Pressure 13)  Tramadol Hcl 50 Mg Tabs (Tramadol Hcl) .... One By Mouth Qid As Needed For Pain 14)  Furosemide 20 Mg Tabs (Furosemide) .... Take 1 Tablet By Mouth Once A Day 15)  Amlodipine Besylate 10 Mg Tabs (Amlodipine Besylate) .... Take 1 Tablet By Mouth Once A Day  Allergies (verified): 1)  ! 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  Alcohol Use - no Daily Caffeine Use  Risk Factors: Alcohol Use: 0 (11/09/2010)  Risk Factors: Smoking Status: never (11/09/2010)  Past Medical History: Current Problems:  PERSONAL HISTORY OF COLONIC POLYPS (ICD-V12.72) HEMORRHOIDS (ICD-455.6) GERD (ICD-530.81) ESOPHAGEAL STRICTURE (ICD-530.3) HIATAL HERNIA (ICD-553.3) ENCOUNTER FOR LONG-TERM USE OF ANTICOAGULANTS (ICD-V58.61) HYPERGLYCEMIA (ICD-790.29) COUGH (ICD-786.2) WEIGHT LOSS, ABNORMAL (ICD-783.21) MEMORY LOSS (ICD-780.93) BACK PAIN, LUMBAR (ICD-724.2) ARTHRALGIA (ICD-719.40) ECZEMA, ATOPIC (ICD-691.8) RENAL DISEASE (ICD-593.9) CONSTIPATION (ICD-564.00) ACUTE VENOUS EMBO & THROMB  SUP VEINS UPPER EXT (ICD-453.81) HYPERTENSION (ICD-401.9) DEPRESSION (ICD-311) OBESITY, UNSPECIFIED (ICD-278.00) HYPOKALEMIA (ICD-276.8) HYPERCHOLESTEROLEMIA (ICD-272.0) Anemia-NOS  Past Surgical History: Reviewed history from 08/01/2010 and no changes required. 1980-tumor removed 1985-blood clots removed  Family History: Reviewed history from 08/08/2010 and no changes required. Family History of Colon Cancer:Brother Family History of Diabetes: Brother  Social History: Reviewed history from 08/01/2010 and no changes required. 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  Alcohol Use - no Daily Caffeine Use  Review of Systems  The patient denies anorexia, fever, weight loss, weight gain, chest pain, syncope, dyspnea on exertion, peripheral edema, prolonged cough, headaches, hemoptysis, abdominal pain, hematuria, suspicious skin lesions, and depression.   Resp:  Denies chest discomfort, chest pain with inspiration, cough, coughing up blood, pleuritic, shortness of breath, sputum productive, and wheezing. Endo:  Denies cold intolerance, excessive hunger, excessive thirst, excessive urination,  heat intolerance, polyuria, and weight change. Heme:  Denies abnormal bruising, bleeding, enlarge lymph nodes, fevers, pallor, and skin discoloration.  Physical Exam  General:  alert, well-developed, well-nourished, well-hydrated, appropriate dress, normal appearance, healthy-appearing, cooperative to examination, and overweight-appearing.   Head:  normocephalic, atraumatic, no abnormalities observed, and no abnormalities palpated.   Mouth:  Oral mucosa and oropharynx without lesions or exudates.  Teeth in good repair. Neck:  supple, full ROM, no masses, no thyromegaly, no JVD, normal carotid upstroke, no carotid bruits, no cervical lymphadenopathy, and no neck tenderness.   Lungs:  normal respiratory effort, no intercostal retractions, no accessory muscle use, normal breath sounds, no dullness, no fremitus, no crackles, and no wheezes.   Heart:  normal rate, regular rhythm, no murmur, no gallop, no rub, and no JVD.   Abdomen:  soft, non-tender, normal bowel sounds, no distention, no masses, no guarding, no rigidity, no rebound tenderness, no abdominal hernia, no inguinal hernia, no hepatomegaly, and no splenomegaly.   Msk:  normal ROM, no joint tenderness, no joint swelling, no joint warmth, no redness over joints, no joint deformities, no joint instability, no crepitation, and no muscle atrophy.   Pulses:  R radial normal, L radial normal, R femoral decreased, R popliteal decreased, R posterior tibial decreased, R dorsalis pedis decreased, L femoral decreased, L popliteal decreased, L posterior tibial decreased, and L dorsalis pedis decreased.   Extremities:  trace left pedal edema and trace right pedal edema.   Neurologic:  alert & oriented X3, cranial nerves II-XII intact, strength normal in all extremities, sensation intact to light touch, sensation intact to pinprick, gait normal, and DTRs symmetrical and normal.   Skin:  turgor normal, color normal, no rashes, no suspicious lesions, no  ecchymoses, no petechiae, no purpura, no ulcerations, and no edema.   Cervical Nodes:  no anterior cervical adenopathy and no posterior cervical adenopathy.   Psych:  normally interactive, good eye contact, not anxious appearing, not depressed appearing, easily distracted, poor concentration, memory impairment, disoriented to time, and judgment poor.     Impression & Recommendations:  Problem # 1:  SENILE DEMENTIA, UNCOMPLICATED (ICD-290.0) Assessment New will ask HHC to help with the meds and see abou the home environment Orders: Home Health Referral (Home Health)  Problem # 2:  HYPERGLYCEMIA (ICD-790.29) Assessment: Unchanged  Orders: Venipuncture (86578) TLB-CBC Platelet - w/Differential (85025-CBCD) TLB-BMP (Basic Metabolic Panel-BMET) (80048-METABOL) TLB-Hepatic/Liver Function Pnl (80076-HEPATIC) TLB-TSH (Thyroid Stimulating Hormone) (84443-TSH) TLB-A1C / Hgb A1C (Glycohemoglobin) (83036-A1C) Home Health Referral (Home Health)  Labs Reviewed: Creat: 1.2 (08/08/2010)  Problem # 3:  RENAL DISEASE (ICD-593.9) will recheck her renal function and lytes and see is there has been a setback from polypharmacy Orders: Venipuncture (78469) TLB-CBC Platelet - w/Differential (85025-CBCD) TLB-BMP (Basic Metabolic Panel-BMET) (80048-METABOL) TLB-Hepatic/Liver Function Pnl (80076-HEPATIC) TLB-TSH (Thyroid Stimulating Hormone) (84443-TSH) TLB-A1C / Hgb A1C (Glycohemoglobin) (83036-A1C)  Problem # 4:  DVT (ICD-453.40) Assessment: Improved  Problem # 5:  HYPERTENSION (ICD-401.9) Assessment: Improved  Her updated medication list for this problem includes:    Metoprolol Tartrate 25 Mg Tabs (Metoprolol tartrate) .Marland Kitchen... Take one by mouth two times a day    Clonidine Hcl 0.2 Mg Tabs (Clonidine hcl) .Marland Kitchen... Take one by mouth two times a day    Losartan Potassium-hctz 50-12.5 Mg Tabs (Losartan potassium-hctz) ..... One by mouth once daily for high blood pressure    Furosemide 20 Mg Tabs  (Furosemide) .Marland Kitchen... Take 1 tablet by mouth once a day    Amlodipine Besylate 10 Mg Tabs (Amlodipine besylate) .Marland Kitchen... Take 1 tablet by mouth once a day  Orders: Venipuncture (62952) TLB-CBC Platelet - w/Differential (85025-CBCD) TLB-BMP (Basic Metabolic Panel-BMET) (80048-METABOL) TLB-Hepatic/Liver Function Pnl (80076-HEPATIC) TLB-TSH (Thyroid Stimulating Hormone) (84443-TSH) TLB-A1C / Hgb A1C (Glycohemoglobin) (83036-A1C)  Complete Medication List: 1)  Metoprolol Tartrate 25 Mg Tabs (Metoprolol tartrate) .... Take one by mouth two times a day 2)  Calcium 500+d 500-400 Mg-unit Tabs (Calcium carbonate-vitamin d) .... Take one by mouth three times a day 3)  Clonidine Hcl 0.2 Mg Tabs (Clonidine hcl) .... Take one by mouth two times a day 4)  Citalopram Hydrobromide 10 Mg Tabs (Citalopram hydrobromide) .... Take one by mouth once daily 5)  Klor-con M20 20 Meq Cr-tabs (Potassium chloride crys cr) .... Take one by mouth two times a day 6)  Omeprazole 20 Mg Cpdr (Omeprazole) .... Take one by mouth once daily 7)  Pravastatin Sodium 40 Mg Tabs (Pravastatin sodium) .... Take one by mouth once daily 8)  Miralax Powd (Polyethylene glycol 3350) .... Use one capfull in 8 ounces of water and drink nightly 9)  Keflex 500 Mg Caps (Cephalexin) .... 2 once daily 10)  Tylenol With Codeine #3 300-30 Mg Tabs (Acetaminophen-codeine) .Marland Kitchen.. 1 at bedtime 11)  Coumadin 5 Mg Tabs (Warfarin sodium) .... One by mouth once daily 12)  Losartan Potassium-hctz 50-12.5 Mg Tabs (Losartan potassium-hctz) .... One by mouth once daily for high blood pressure 13)  Tramadol Hcl 50 Mg Tabs (Tramadol hcl) .... One by mouth qid as needed for pain 14)  Furosemide 20 Mg Tabs (Furosemide) .... Take 1 tablet by mouth once a day 15)  Amlodipine Besylate 10 Mg Tabs (Amlodipine besylate) .... Take 1 tablet by mouth once a day  Patient Instructions: 1)  Please schedule a follow-up appointment in 2 months. 2)  Check your Blood Pressure  regularly. If it is above 130/80: you should make an appointment.   Orders Added: 1)  Venipuncture [36415] 2)  TLB-CBC Platelet - w/Differential [85025-CBCD] 3)  TLB-BMP (Basic Metabolic Panel-BMET) [80048-METABOL] 4)  TLB-Hepatic/Liver Function Pnl [80076-HEPATIC] 5)  TLB-TSH (Thyroid Stimulating Hormone) [84443-TSH] 6)  TLB-A1C / Hgb A1C (Glycohemoglobin) [83036-A1C] 7)  Home Health Referral [facility]] 8)  Est. Patient Level IV [84132]

## 2011-01-04 NOTE — Medication Information (Signed)
Summary: rov/mw  Anticoagulant Therapy  Managed by: Cloyde Reams, RN, BSN Referring MD: Yetta Barre MD PCP: Etta Grandchild MD Supervising MD: Jens Som MD, Arlys John Indication 1: DVT Lab Used: [facility] [place] Site: [address] INR POC 2.7 INR RANGE 2-3  Dietary changes: no    Health status changes: no    Bleeding/hemorrhagic complications: no    Recent/future hospitalizations: no    Any changes in medication regimen? no    Recent/future dental: no  Any missed doses?: no       Is patient compliant with meds? yes       Allergies: 1)  ! Amlodipine Besylate  Anticoagulation Management History:      The patient is taking warfarin and comes in today for a routine follow up visit.  Positive risk factors for bleeding include an age of 75 years or older.  The bleeding index is 'intermediate risk'.  Positive CHADS2 values include History of HTN and Age > 75 years old.  Her last INR was 1.1 ratio.  Anticoagulation responsible provider: Jens Som MD, Arlys John.  INR POC: 2.7.  Cuvette Lot#: [id number].  Exp: 10/2011.    Anticoagulation Management Assessment/Plan:      The patient's current anticoagulation dose is Coumadin 5 mg tabs: One by mouth once daily.  The target INR is 2.0-3.0.  The next INR is due 09/19/2010.  Results were reviewed/authorized by Cloyde Reams, RN, BSN.  She was notified by Cloyde Reams RN.         Prior Anticoagulation Instructions: INR 2.2 Continue taking 1 tablet daily. No changes today. Recheck in 10 days.   Current Anticoagulation Instructions: INR 2.7  Continue on same dosage 1 tablet daily.  Recheck in  2 weeks.

## 2011-01-04 NOTE — Medication Information (Signed)
Summary: rov/tm  Anticoagulant Therapy  Managed by: Bethena Midget, RN, BSN Referring MD: Yetta Barre MD PCP: Etta Grandchild MD Supervising MD: Excell Seltzer MD, Casimiro Needle Indication 1: DVT Lab Used: [facility] [place] Site: [address] INR POC 2.0 INR RANGE 2-3  Dietary changes: no    Health status changes: no    Bleeding/hemorrhagic complications: no    Recent/future hospitalizations: no    Any changes in medication regimen? no    Recent/future dental: no  Any missed doses?: no       Is patient compliant with meds? yes       Allergies: 1)  ! Amlodipine Besylate  Anticoagulation Management History:      The patient is taking warfarin and comes in today for a routine follow up visit.  Positive risk factors for bleeding include an age of 75 years or older.  The bleeding index is 'intermediate risk'.  Positive CHADS2 values include History of HTN and Age > 42 years old.  Her last INR was 1.1 ratio.  Anticoagulation responsible provider: Excell Seltzer MD, Casimiro Needle.  INR POC: 2.0.  Cuvette Lot#: [id number].  Exp: 11/2011.    Anticoagulation Management Assessment/Plan:      The patient's current anticoagulation dose is Coumadin 5 mg tabs: One by mouth once daily.  The target INR is 2.0-3.0.  The next INR is due 11/14/2010.  Anticoagulation instructions were given to patient.  Results were reviewed/authorized by Bethena Midget, RN, BSN.  She was notified by Bethena Midget, RN, BSN.         Prior Anticoagulation Instructions: INR 2.4 Continue 5mg s everyday. Recheck in 4 weeks.   Current Anticoagulation Instructions: INR 2.0 Today take extra 1/2 pill then resume 1 pill everyday. Recheck in 4 weeks.

## 2011-01-04 NOTE — Progress Notes (Signed)
    Mammogram Screening:    Last Mammogram:  10/17/2010  Mammogram Results:    Date of Exam:  10/17/2010    Results:  Normal Bilateral  Osteoporosis Risk Assessment:  Risk Factors for Fracture or Low Bone Density:   Smoking status:       never  Immunization & Chemoprophylaxis:    Tetanus vaccine: Tdap  (09/20/2010)    Influenza vaccine: Fluvax 3+  (09/20/2010)

## 2011-01-04 NOTE — Progress Notes (Signed)
  Phone Note Other Incoming   Caller: [facility] [phone] Summary of Call: Buzz and RN with [facility] calling with an update on pts BP this morning. BP was 203/104, please Advise? Initial call taken by: Ami Bullins CMA,  November 20, 2010 8:21 AM  Follow-up for Phone Call        informed Buzz with [facility] and informed pt Follow-up by: Ami Bullins CMA,  November 20, 2010 5:13 PM    New/Updated Medications: LOSARTAN POTASSIUM-HCTZ 100-25 MG TABS (LOSARTAN POTASSIUM-HCTZ) One by mouth once daily for high blood pressure Prescriptions: LOSARTAN POTASSIUM-HCTZ 100-25 MG TABS (LOSARTAN POTASSIUM-HCTZ) One by mouth once daily for high blood pressure  #30 x 11   Entered and Authorized by:   Etta Grandchild MD   Signed by:   Etta Grandchild MD on 11/20/2010   Method used:   Electronically to        [facility] (retail)       [address]       [place], [place]  [address]       Ph: [phone]       Fax: [phone]   RxID:   [id number]

## 2011-01-04 NOTE — Progress Notes (Signed)
Summary: [facility] UPDATE  Phone Note From Other Clinic   Summary of Call: Home Health RN called w/update. BP today was 202/106. RN did med box fill on Wednesday. Pt did not take her meds yesterday. RN will stress importance of pt taking meds w/family and make another home visit Monday. He will call back w/ update on Mon.  Initial call taken by: Lamar Sprinkles, CMA,  November 17, 2010 9:08 AM

## 2011-01-04 NOTE — Medication Information (Signed)
Summary: [facility]  Anticoagulant Therapy  Managed by: Louann Sjogren, PharmD Referring MD: Yetta Barre MD PCP: Etta Grandchild MD Supervising MD: Excell Seltzer MD, Casimiro Needle Indication 1: DVT Lab Used: [facility] [place] Site: [address] INR POC 2.2 INR RANGE 2-3  Dietary changes: no    Health status changes: no    Bleeding/hemorrhagic complications: no    Recent/future hospitalizations: no    Any changes in medication regimen? no    Recent/future dental: no  Any missed doses?: no       Is patient compliant with meds? yes       Allergies: 1)  ! Amlodipine Besylate  Anticoagulation Management History:      The patient is taking warfarin and comes in today for a routine follow up visit.  Positive risk factors for bleeding include an age of 75 years or older.  The bleeding index is 'intermediate risk'.  Positive CHADS2 values include History of HTN and Age > 79 years old.  Her last INR was 1.1 ratio and today's INR is 2.2.  Anticoagulation responsible provider: Excell Seltzer MD, Casimiro Needle.  INR POC: 2.2.  Cuvette Lot#: [id number].  Exp: 11/2011.    Anticoagulation Management Assessment/Plan:      The patient's current anticoagulation dose is Coumadin 5 mg tabs: One by mouth once daily.  The target INR is 2.0-3.0.  The next INR is due 01/08/2011.  Anticoagulation instructions were given to patient.  Results were reviewed/authorized by Louann Sjogren, PharmD.  She was notified by Louann Sjogren PharmD.         Prior Anticoagulation Instructions: INR 2.0 Today take extra 1/2 pill then resume 1 pill everyday. Recheck in 4 weeks.   Current Anticoagulation Instructions: INR 2.2 (goal 2-3)  Continue taking 1 tablet everyday. Next appointment: Monday, Feb. 6th at 2:30PM.

## 2011-01-10 NOTE — Progress Notes (Signed)
  Phone Note Refill Request Message from:  Fax from Pharmacy on January 02, 2011 11:17 AM  Refills Requested: Medication #1:  CLONIDINE HCL 0.2 MG TABS take one by mouth two times a day   Dosage confirmed as above?Dosage Confirmed   Last Refilled: 11/23/2010  Medication #2:  KLOR-CON M20 20 MEQ CR-TABS take one by mouth two times a day   Dosage confirmed as above?Dosage Confirmed   Last Refilled: 12/29/2010 Never filled by our office per EMR// Is this ok to refill?    Follow-up for Phone Call        yes Follow-up by: Etta Grandchild MD,  January 02, 2011 11:19 AM    Prescriptions: KLOR-CON M20 20 MEQ CR-TABS (POTASSIUM CHLORIDE CRYS CR) take one by mouth two times a day  #60 x 11   Entered by:   Rock Nephew CMA   Authorized by:   Etta Grandchild MD   Signed by:   Rock Nephew CMA on 01/02/2011   Method used:   Faxed to ...       [facility] (retail)       [address]       [place], [place]  [address]       Ph: [phone]       Fax: [phone]   RxID:   [id number] CLONIDINE HCL 0.2 MG TABS (CLONIDINE HCL) take one by mouth two times a day  #60 x 5   Entered by:   Rock Nephew CMA   Authorized by:   Etta Grandchild MD   Signed by:   Rock Nephew CMA on 01/02/2011   Method used:   Faxed to ...       [facility] (retail)       [address]       [place], [place]  [address]       Ph: [phone]       Fax: [phone]   RxID:   [id number]

## 2011-01-19 ENCOUNTER — Encounter (INDEPENDENT_AMBULATORY_CARE_PROVIDER_SITE_OTHER): Payer: Medicare Other

## 2011-01-19 ENCOUNTER — Encounter: Payer: Self-pay | Admitting: Cardiology

## 2011-01-19 DIAGNOSIS — I80299 Phlebitis and thrombophlebitis of other deep vessels of unspecified lower extremity: Secondary | ICD-10-CM

## 2011-01-19 DIAGNOSIS — Z7901 Long term (current) use of anticoagulants: Secondary | ICD-10-CM

## 2011-01-19 LAB — CONVERTED CEMR LAB: POC INR: 2

## 2011-01-23 DIAGNOSIS — I82409 Acute embolism and thrombosis of unspecified deep veins of unspecified lower extremity: Secondary | ICD-10-CM

## 2011-01-24 NOTE — Medication Information (Signed)
Summary: rov/ewj  Anticoagulant Therapy  Managed by: Weston Brass, PharmD Referring MD: Yetta Barre MD PCP: Etta Grandchild MD Supervising MD: Daleen Squibb MD, Maisie Fus Indication 1: DVT Lab Used: [facility] [place] Site: [address] INR POC 2.0 INR RANGE 2-3  Dietary changes: no    Health status changes: no    Bleeding/hemorrhagic complications: no    Recent/future hospitalizations: no    Any changes in medication regimen? no    Recent/future dental: no  Any missed doses?: no       Is patient compliant with meds? yes       Allergies: 1)  ! Amlodipine Besylate  Anticoagulation Management History:      The patient is taking warfarin and comes in today for a routine follow up visit.  Positive risk factors for bleeding include an age of 75 years or older.  The bleeding index is 'intermediate risk'.  Positive CHADS2 values include History of HTN and Age > 79 years old.  Her last INR was 2.2.  Anticoagulation responsible provider: Daleen Squibb MD, Maisie Fus.  INR POC: 2.0.  Cuvette Lot#: [id number].  Exp: 12/2011.    Anticoagulation Management Assessment/Plan:      The patient's current anticoagulation dose is Coumadin 5 mg tabs: One by mouth once daily.  The target INR is 2.0-3.0.  The next INR is due 02/16/2011.  Anticoagulation instructions were given to patient.  Results were reviewed/authorized by Weston Brass, PharmD.  She was notified by Margot Chimes PharmD Candidate.         Prior Anticoagulation Instructions: INR 2.2 (goal 2-3)  Continue taking 1 tablet everyday. Next appointment: Monday, Feb. 6th at 2:30PM.  Current Anticoagulation Instructions: INR 2.0  Continue your current dose of 1 tablet everyday.  Recheck INR in 4 weeks.

## 2011-01-25 ENCOUNTER — Ambulatory Visit (INDEPENDENT_AMBULATORY_CARE_PROVIDER_SITE_OTHER): Payer: Medicare Other | Admitting: Internal Medicine

## 2011-01-25 ENCOUNTER — Encounter: Payer: Self-pay | Admitting: Internal Medicine

## 2011-01-25 DIAGNOSIS — R609 Edema, unspecified: Secondary | ICD-10-CM

## 2011-01-25 DIAGNOSIS — F0390 Unspecified dementia without behavioral disturbance: Secondary | ICD-10-CM

## 2011-01-25 DIAGNOSIS — I10 Essential (primary) hypertension: Secondary | ICD-10-CM

## 2011-01-30 ENCOUNTER — Telehealth: Payer: Self-pay | Admitting: Internal Medicine

## 2011-01-30 NOTE — Assessment & Plan Note (Signed)
Summary: 4 MO FU  Annette Mckay   Vital Signs:  Patient profile:   77 year old female Menstrual status:  postmenopausal Height:      61 inches Weight:      194 pounds O2 Sat:      95 % Temp:     98.7 degrees F oral Pulse rate:   64 / minute Pulse rhythm:   regular Resp:     16 per minute BP sitting:   128 / 70  (left arm) Cuff size:   large  Vitals Entered By: Lamar Sprinkles, CMA (January 25, 2011 3:36 PM) CC: F/u, discuss meds/SD Is Patient Diabetic? No Pain Assessment Patient in pain? no        Primary Care Provider:  Etta Grandchild MD  CC:  F/u and discuss meds/SD.  History of Present Illness:  Follow-Up Visit      This is an 77 year old woman who presents for Follow-up visit.  The patient denies chest pain, palpitations, dizziness, syncope, edema, SOB, DOE, PND, and orthopnea.  Since the last visit the patient notes no new problems or concerns.  The patient reports taking meds as prescribed, monitoring BP, and dietary compliance.  When questioned about possible medication side effects, the patient notes none.    Preventive Screening-Counseling & Management  Alcohol-Tobacco     Alcohol drinks/day: 0     Alcohol Counseling: not indicated; patient does not drink     Smoking Status: never     Tobacco Counseling: not indicated; no tobacco use  Hep-HIV-STD-Contraception     Hepatitis Risk: no risk noted     HIV Risk: no risk noted     STD Risk: no risk noted      Drug Use:  never.    Clinical Review Panels:  Prevention   Last Mammogram:  Normal Bilateral (10/17/2010)   Last Colonoscopy:  DONE (08/16/2010)  Immunizations   Last Tetanus Booster:  Tdap (09/20/2010)   Last Flu Vaccine:  Fluvax 3+ (09/20/2010)  Diabetes Management   HgBA1C:  5.4 (11/09/2010)   Creatinine:  1.1 (11/09/2010)   Last Flu Vaccine:  Fluvax 3+ (09/20/2010)  CBC   WBC:  3.1 (11/09/2010)   RBC:  3.54 (11/09/2010)   Hgb:  12.6 (11/09/2010)   Hct:  36.4 (11/09/2010)   Platelets:   169.0 (11/09/2010)   MCV  103.0 (11/09/2010)   MCHC  34.4 (11/09/2010)   RDW  12.7 (11/09/2010)   PMN:  56.6 (11/09/2010)   Lymphs:  31.1 (11/09/2010)   Monos:  9.0 (11/09/2010)   Eosinophils:  2.2 (11/09/2010)   Basophil:  1.1 (11/09/2010)  Complete Metabolic Panel   Glucose:  101 (11/09/2010)   Sodium:  140 (11/09/2010)   Potassium:  4.8 (11/09/2010)   Chloride:  106 (11/09/2010)   CO2:  28 (11/09/2010)   BUN:  25 (11/09/2010)   Creatinine:  1.1 (11/09/2010)   Albumin:  3.8 (11/09/2010)   Total Protein:  7.6 (11/09/2010)   Calcium:  10.6 (11/09/2010)   Total Bili:  0.5 (11/09/2010)   Alk Phos:  76 (11/09/2010)   SGPT (ALT):  33 (11/09/2010)   SGOT (AST):  44 (11/09/2010)   Medications Prior to Update: 1)  Metoprolol Tartrate 25 Mg Tabs (Metoprolol Tartrate) .... Take One By Mouth Two Times A Day 2)  Calcium 500+d 500-400 Mg-Unit Tabs (Calcium Carbonate-Vitamin D) .... Take One By Mouth Three Times A Day 3)  Clonidine Hcl 0.2 Mg Tabs (Clonidine Hcl) .... Take One By Mouth  Two Times A Day 4)  Citalopram Hydrobromide 10 Mg Tabs (Citalopram Hydrobromide) .... Take One By Mouth Once Daily 5)  Klor-Con M20 20 Meq Cr-Tabs (Potassium Chloride Crys Cr) .... Take One By Mouth Two Times A Day 6)  Omeprazole 20 Mg Cpdr (Omeprazole) .... Take One By Mouth Once Daily 7)  Pravastatin Sodium 40 Mg Tabs (Pravastatin Sodium) .... Take One By Mouth Once Daily 8)  Miralax  Powd (Polyethylene Glycol 3350) .... Use One Capfull in 8 Ounces of Water and Drink Nightly 9)  Keflex 500 Mg Caps (Cephalexin) .... 2 Once Daily 10)  Tylenol With Codeine #3 300-30 Mg Tabs (Acetaminophen-Codeine) .Marland Kitchen.. 1 At Bedtime 11)  Coumadin 5 Mg Tabs (Warfarin Sodium) .... One By Mouth Once Daily 12)  Tramadol Hcl 50 Mg Tabs (Tramadol Hcl) .... One By Mouth Qid As Needed For Pain 13)  Furosemide 20 Mg Tabs (Furosemide) .... Take 1 Tablet By Mouth Once A Day 14)  Amlodipine Besylate 10 Mg Tabs (Amlodipine Besylate) ....  Take 1 Tablet By Mouth Once A Day 15)  Losartan Potassium-Hctz 100-25 Mg Tabs (Losartan Potassium-Hctz) .... One By Mouth Once Daily For High Blood Pressure  Current Medications (verified): 1)  Metoprolol Tartrate 25 Mg Tabs (Metoprolol Tartrate) .Marland Kitchen.. 1 Once Daily 2)  Calcium 500+d 500-400 Mg-Unit Tabs (Calcium Carbonate-Vitamin D) .... Take One By Mouth Three Times A Day 3)  Clonidine Hcl 0.2 Mg Tabs (Clonidine Hcl) .Marland Kitchen.. 1 Once Daily 4)  Citalopram Hydrobromide 10 Mg Tabs (Citalopram Hydrobromide) .... Take One By Mouth Once Daily 5)  Klor-Con M20 20 Meq Cr-Tabs (Potassium Chloride Crys Cr) .... Take One By Mouth Two Times A Day 6)  Omeprazole 20 Mg Cpdr (Omeprazole) .... Take One By Mouth Once Daily 7)  Pravastatin Sodium 40 Mg Tabs (Pravastatin Sodium) .... Take One By Mouth Once Daily 8)  Miralax  Powd (Polyethylene Glycol 3350) .... Use One Capfull in 8 Ounces of Water and Drink Nightly 9)  Keflex 500 Mg Caps (Cephalexin) .... 2 Once Daily 10)  Tylenol With Codeine #3 300-30 Mg Tabs (Acetaminophen-Codeine) .Marland Kitchen.. 1 At Bedtime As Needed 11)  Coumadin 5 Mg Tabs (Warfarin Sodium) .... One By Mouth Once Daily 12)  Tramadol Hcl 50 Mg Tabs (Tramadol Hcl) .... One By Mouth Qid As Needed For Pain 13)  Furosemide 20 Mg Tabs (Furosemide) .... Take 1 Tablet By Mouth Once A Day As Needed For Swelling 14)  Amlodipine Besylate 10 Mg Tabs (Amlodipine Besylate) .... Take 1 Tablet By Mouth Once A Day 15)  Losartan Potassium-Hctz 100-25 Mg Tabs (Losartan Potassium-Hctz) .... One By Mouth Once Daily For High Blood Pressure  Allergies (verified): No Known Drug Allergies  Past History:  Past Medical History: Last updated: 11/09/2010 Current Problems:  PERSONAL HISTORY OF COLONIC POLYPS (ICD-V12.72) HEMORRHOIDS (ICD-455.6) GERD (ICD-530.81) ESOPHAGEAL STRICTURE (ICD-530.3) HIATAL HERNIA (ICD-553.3) ENCOUNTER FOR LONG-TERM USE OF ANTICOAGULANTS (ICD-V58.61) HYPERGLYCEMIA (ICD-790.29) COUGH  (ICD-786.2) WEIGHT LOSS, ABNORMAL (ICD-783.21) MEMORY LOSS (ICD-780.93) BACK PAIN, LUMBAR (ICD-724.2) ARTHRALGIA (ICD-719.40) ECZEMA, ATOPIC (ICD-691.8) RENAL DISEASE (ICD-593.9) CONSTIPATION (ICD-564.00) ACUTE VENOUS EMBO & THROMB SUP VEINS UPPER EXT (ICD-453.81) HYPERTENSION (ICD-401.9) DEPRESSION (ICD-311) OBESITY, UNSPECIFIED (ICD-278.00) HYPOKALEMIA (ICD-276.8) HYPERCHOLESTEROLEMIA (ICD-272.0) Anemia-NOS  Past Surgical History: Last updated: 08/01/2010 1980-tumor removed 1985-blood clots removed  Family History: Last updated: 08/08/2010 Family History of Colon Cancer:Brother Family History of Diabetes: Brother  Social History: Last updated: 08/01/2010 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  Alcohol Use - no Daily  Caffeine Use  Risk Factors: Alcohol Use: 0 (01/25/2011)  Risk Factors: Smoking Status: never (01/25/2011)  Family History: Reviewed history from 08/08/2010 and no changes required. Family History of Colon Cancer:Brother Family History of Diabetes: Brother  Social History: Reviewed history from 08/01/2010 and no changes required. 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  Alcohol Use - no Daily Caffeine Use Drug Use:  never  Review of Systems       The patient complains of weight gain.  The patient denies anorexia, fever, weight loss, chest pain, syncope, dyspnea on exertion, peripheral edema, prolonged cough, headaches, hemoptysis, abdominal pain, hematuria, muscle weakness, difficulty walking, and depression.    Physical Exam  General:  alert, well-developed, well-nourished, well-hydrated, appropriate dress, normal appearance, healthy-appearing, cooperative to examination, and overweight-appearing.   Head:  normocephalic, atraumatic, no abnormalities observed, and no abnormalities palpated.    Mouth:  Oral mucosa and oropharynx without lesions or exudates.  Teeth in good repair. Neck:  supple, full ROM, no masses, no thyromegaly, no JVD, normal carotid upstroke, no carotid bruits, no cervical lymphadenopathy, and no neck tenderness.   Lungs:  normal respiratory effort, no intercostal retractions, no accessory muscle use, normal breath sounds, no dullness, no fremitus, no crackles, and no wheezes.   Heart:  normal rate, regular rhythm, no murmur, no gallop, no rub, and no JVD.   Abdomen:  soft, non-tender, normal bowel sounds, no distention, no masses, no guarding, no rigidity, no rebound tenderness, no abdominal hernia, no inguinal hernia, no hepatomegaly, and no splenomegaly.   Msk:  normal ROM, no joint tenderness, no joint swelling, no joint warmth, no redness over joints, no joint deformities, no joint instability, no crepitation, and no muscle atrophy.   Pulses:  R radial normal, L radial normal, R femoral decreased, R popliteal decreased, R posterior tibial decreased, R dorsalis pedis decreased, L femoral decreased, L popliteal decreased, L posterior tibial decreased, and L dorsalis pedis decreased.   Extremities:  trace left pedal edema and trace right pedal edema.   Neurologic:  alert & oriented X3, cranial nerves II-XII intact, strength normal in all extremities, sensation intact to light touch, sensation intact to pinprick, gait normal, and DTRs symmetrical and normal.   Cervical Nodes:  no anterior cervical adenopathy and no posterior cervical adenopathy.   Psych:  normally interactive, good eye contact, not anxious appearing, not depressed appearing, easily distracted, poor concentration, memory impairment, disoriented to time, and judgment poor.     Impression & Recommendations:  Problem # 1:  EDEMA (ICD-782.3) Assessment Improved  Her updated medication list for this problem includes:    Furosemide 20 Mg Tabs (Furosemide) .Marland Kitchen... Take 1 tablet by mouth once a day as needed  for swelling    Losartan Potassium-hctz 100-25 Mg Tabs (Losartan potassium-hctz) ..... One by mouth once daily for high blood pressure  Discussed elevation of the legs, use of compression stockings, sodium restiction, and medication use.   Problem # 2:  SENILE DEMENTIA, UNCOMPLICATED (ICD-290.0) Assessment: Unchanged  Problem # 3:  HYPERTENSION (ICD-401.9) Assessment: Improved  Her updated medication list for this problem includes:    Metoprolol Tartrate 25 Mg Tabs (Metoprolol tartrate) .Marland Kitchen... 1 once daily    Clonidine Hcl 0.2 Mg Tabs (Clonidine hcl) .Marland Kitchen... 1 once daily    Furosemide 20 Mg Tabs (Furosemide) .Marland Kitchen... Take 1 tablet by mouth once a day as needed for swelling    Amlodipine Besylate 10 Mg Tabs (Amlodipine  besylate) .Marland Kitchen... Take 1 tablet by mouth once a day    Losartan Potassium-hctz 100-25 Mg Tabs (Losartan potassium-hctz) ..... One by mouth once daily for high blood pressure  BP today: 128/70 Prior BP: 136/72 (11/09/2010)  Labs Reviewed: K+: 4.8 (11/09/2010) Creat: : 1.1 (11/09/2010)     Complete Medication List: 1)  Metoprolol Tartrate 25 Mg Tabs (Metoprolol tartrate) .Marland Kitchen.. 1 once daily 2)  Calcium 500+d 500-400 Mg-unit Tabs (Calcium carbonate-vitamin d) .... Take one by mouth three times a day 3)  Clonidine Hcl 0.2 Mg Tabs (Clonidine hcl) .Marland Kitchen.. 1 once daily 4)  Citalopram Hydrobromide 10 Mg Tabs (Citalopram hydrobromide) .... Take one by mouth once daily 5)  Klor-con M20 20 Meq Cr-tabs (Potassium chloride crys cr) .... Take one by mouth two times a day 6)  Omeprazole 20 Mg Cpdr (Omeprazole) .... Take one by mouth once daily 7)  Pravastatin Sodium 40 Mg Tabs (Pravastatin sodium) .... Take one by mouth once daily 8)  Miralax Powd (Polyethylene glycol 3350) .... Use one capfull in 8 ounces of water and drink nightly 9)  Keflex 500 Mg Caps (Cephalexin) .... 2 once daily 10)  Tylenol With Codeine #3 300-30 Mg Tabs (Acetaminophen-codeine) .Marland Kitchen.. 1 at bedtime as needed 11)  Coumadin  5 Mg Tabs (Warfarin sodium) .... One by mouth once daily 12)  Tramadol Hcl 50 Mg Tabs (Tramadol hcl) .... One by mouth qid as needed for pain 13)  Furosemide 20 Mg Tabs (Furosemide) .... Take 1 tablet by mouth once a day as needed for swelling 14)  Amlodipine Besylate 10 Mg Tabs (Amlodipine besylate) .... Take 1 tablet by mouth once a day 15)  Losartan Potassium-hctz 100-25 Mg Tabs (Losartan potassium-hctz) .... One by mouth once daily for high blood pressure  Patient Instructions: 1)  Please schedule a follow-up appointment in 4 months. 2)  Check your Blood Pressure regularly. If it is above 130/80: you should make an appointment. 3)  Limit your Sodium (Salt) to less than 4 grams a day (slightly less than 1 teaspoon) to prevent fluid retention, swelling, or worsening or symptoms. 4)  It is important that you exercise regularly at least 20 minutes 5 times a week. If you develop chest pain, have severe difficulty breathing, or feel very tired , stop exercising immediately and seek medical attention. 5)  You need to lose weight. Consider a lower calorie diet and regular exercise.    Orders Added: 1)  Est. Patient Level III [81191]

## 2011-02-08 NOTE — Progress Notes (Signed)
  Phone Note Refill Request Message from:  Fax from Pharmacy on January 30, 2011 10:10 AM  Refills Requested: Medication #1:  AMLODIPINE BESYLATE 10 MG TABS Take 1 tablet by mouth once a day Initial call taken by: Rock Nephew CMA,  January 30, 2011 10:10 AM    Prescriptions: AMLODIPINE BESYLATE 10 MG TABS (AMLODIPINE BESYLATE) Take 1 tablet by mouth once a day  #30 x 6   Entered by:   Rock Nephew CMA   Authorized by:   Etta Grandchild MD   Signed by:   Rock Nephew CMA on 01/30/2011   Method used:   Electronically to        [facility] (retail)       [address]       [place], [place]  [address]       Ph: [phone]       Fax: [phone]   RxID:   [id number]

## 2011-03-01 ENCOUNTER — Telehealth: Payer: Self-pay | Admitting: *Deleted

## 2011-03-01 MED ORDER — FEXOFENADINE HCL 180 MG PO TABS: 180.0000 mg | ORAL_TABLET | Freq: Every day | ORAL | Status: DC

## 2011-03-01 NOTE — Telephone Encounter (Signed)
Patient requesting rx from MD for her allergies that is safe to take with coumadin.

## 2011-03-01 NOTE — Telephone Encounter (Signed)
Called patient and she is not sure what pharmacy she uses. She will check with caregiver and call us back

## 2011-03-01 NOTE — Telephone Encounter (Signed)
Done, it needs to be called in

## 2011-03-02 NOTE — Telephone Encounter (Signed)
Pt's pharmacy is [facility] @ [phone]. Called pharmacy to give Rx order for:  fexofenadine (ALLEGRA) 180 MG tablet  30 tablet  11  03/01/2011  02/29/2012   Sig - Route: Take 1 tablet (180 mg total) by mouth daily. - Oral   Class: Phone In   Authorizing Provider: Etta Grandchild, MD     Was told by pharmacist that Pt would have to purchase OTC/would not accept Rx. Called Pt and informed of pharmacy instructions.

## 2011-03-09 LAB — PROTIME-INR: Prothrombin Time: 35.8 seconds — ABNORMAL HIGH (ref 11.6–15.2)

## 2011-03-13 ENCOUNTER — Ambulatory Visit (INDEPENDENT_AMBULATORY_CARE_PROVIDER_SITE_OTHER): Payer: Medicare Other | Admitting: *Deleted

## 2011-03-13 DIAGNOSIS — I82409 Acute embolism and thrombosis of unspecified deep veins of unspecified lower extremity: Secondary | ICD-10-CM

## 2011-03-13 DIAGNOSIS — Z7901 Long term (current) use of anticoagulants: Secondary | ICD-10-CM | POA: Insufficient documentation

## 2011-04-10 ENCOUNTER — Encounter: Payer: Medicare Other | Admitting: *Deleted

## 2011-04-17 NOTE — Consult Note (Signed)
NEW PATIENT CONSULTATION   Annette Mckay, Annette Mckay  DOB:  July 11, 1927                                       09/06/2010  [id number]#:[id number]   I saw the patient in the office today in consultation concerning her  left leg pain.  She was referred by Dr. Allena Katz.  This is a pleasant 77-  year-old woman who noted the gradual onset of left leg pain  approximately a year ago.  She has also noted swelling in the left leg.  Her pain is aggravated by standing and sitting.  There are really no  alleviating factors.  Her pain has gradually increased over the last 2  months.  She underwent a workup for this including an arterial Doppler  study which showed no evidence of significant arterial insufficiency.  Her venous duplex scan showed partially occlusive thrombus in the  proximal left greater saphenous vein with no extension into the common  femoral vein.  She was just recently started on Coumadin 2 weeks ago.  I  did not get any history of claudication, rest pain, or nonhealing  ulcers.   PAST MEDICAL HISTORY:  Significant for hypertension and  hypercholesterolemia.  She denies any history of diabetes, history of  previous myocardial infarction, history of congestive heart failure, or  history of COPD.   SOCIAL HISTORY:  She is widowed.  She has 1 child.  She does not use  tobacco.  She does not drink alcohol.   FAMILY HISTORY:  There is no history of premature cardiovascular  disease.   REVIEW OF SYSTEMS:  GENERAL:  She had some mild weight loss recently.  CARDIOVASCULAR:  She has had no chest pain, chest pressure,  palpitations, or arrhythmias.  She has had no history of stroke or TIAs.  GI:  She has had some blood in her stool recently.  She has also had  problems with constipation.  NEUROLOGIC:  She has had blackouts in the past.  PULMONARY:  She has occasional productive cough.  HEMATOLOGIC:  She has had no bleeding problems or anemia that she is  aware of.  GU:  She does  have urinary frequency.  ENT:  She has had no change in her eyesight, change in hearing,  nosebleeds, or sore throat.  MUSCULOSKELETAL:  She does have a history of arthritis and joint pain.  PSYCHIATRIC:  She has had no depression or anxiety.   PHYSICAL EXAMINATION:  This is a pleasant 77 year old woman who appears  her stated age.  Her blood pressure is 152/76, heart rate is 69,  saturation 97%.  HEENT is unremarkable.  Lungs are clear bilaterally to  auscultation without rales, rhonchi, or wheezing.  On cardiovascular  exam, I do not detect any carotid bruits.  She has a regular rate and  rhythm.  She has palpable femoral pulses and palpable dorsalis pedis  pulses bilaterally.  She has moderate bilateral lower extremity  swelling, thrombosed.  She has no ulcers.  She has hyperpigmentation  bilaterally.  Abdomen is soft and nontender with normal pitched bowel  sounds.  She has some large varicosities in the lower part of her  abdomen.  Musculoskeletal exam has no major deformities or cyanosis.  Neurologic exam:  She has no focal weakness or paresthesias.  Skin:  There are no ulcers or rashes.   I have reviewed her arterial  Doppler study which was done on September  9th.  It showed an ABI of 100% on the right and 100% on the left.  The  right dorsalis pedis signal with triphasic and the posterior tibial  signal on the right was biphasic.  On the left side, she has a triphasic  dorsalis pedis signal with a monophasic posterior tibial signal.  I have  also reviewed her venous duplex scan which shows a partially occlusive  thrombus in the proximal left greater saphenous vein.   Based on her exam and history, I think she has evidence of chronic  venous insufficiency.  I reassured her that I did not see any evidence  of significant arterial insufficiency.  We have discussed the importance  of intermittent leg elevation and compression therapy.  I have written  her a prescription for a mild  compression stocking.  I think this would  be more practical for her and she would be more likely to wear them.  We  have also discussed the proper position for leg elevation and have  encouraged her to elevate her legs often.  I would be happy to see her  back at any time if any new vascular issues arise.     Di Kindle. Edilia Bo, M.D.  Electronically Signed   CSD/MEDQ  D:  09/06/2010  T:  09/07/2010  Job:  [id number]   cc:   Doran Durand, MD

## 2011-05-21 ENCOUNTER — Telehealth: Payer: Self-pay

## 2011-05-21 MED ORDER — OMEPRAZOLE 20 MG PO CPDR: 20.0000 mg | DELAYED_RELEASE_CAPSULE | Freq: Every day | ORAL | Status: DC

## 2011-05-21 NOTE — Telephone Encounter (Signed)
Refill omeprazole

## 2011-05-29 ENCOUNTER — Ambulatory Visit (INDEPENDENT_AMBULATORY_CARE_PROVIDER_SITE_OTHER): Payer: Medicare Other | Admitting: *Deleted

## 2011-05-29 DIAGNOSIS — I82409 Acute embolism and thrombosis of unspecified deep veins of unspecified lower extremity: Secondary | ICD-10-CM

## 2011-05-29 MED ORDER — WARFARIN SODIUM 5 MG PO TABS: ORAL_TABLET | ORAL | Status: DC

## 2011-06-12 ENCOUNTER — Encounter: Payer: Self-pay | Admitting: Internal Medicine

## 2011-06-12 DIAGNOSIS — N289 Disorder of kidney and ureter, unspecified: Secondary | ICD-10-CM

## 2011-06-14 ENCOUNTER — Encounter: Payer: Self-pay | Admitting: Internal Medicine

## 2011-06-14 ENCOUNTER — Ambulatory Visit: Payer: Medicare Other | Admitting: Internal Medicine

## 2011-06-15 ENCOUNTER — Ambulatory Visit: Payer: Medicare Other | Admitting: Internal Medicine

## 2011-06-15 DIAGNOSIS — Z0289 Encounter for other administrative examinations: Secondary | ICD-10-CM

## 2011-06-21 ENCOUNTER — Ambulatory Visit: Payer: Medicare Other | Admitting: Internal Medicine

## 2011-06-26 ENCOUNTER — Encounter: Payer: Medicare Other | Admitting: *Deleted

## 2011-06-27 ENCOUNTER — Other Ambulatory Visit (INDEPENDENT_AMBULATORY_CARE_PROVIDER_SITE_OTHER): Payer: Medicare Other

## 2011-06-27 ENCOUNTER — Encounter: Payer: Self-pay | Admitting: Internal Medicine

## 2011-06-27 ENCOUNTER — Ambulatory Visit (INDEPENDENT_AMBULATORY_CARE_PROVIDER_SITE_OTHER): Payer: Medicare Other | Admitting: Internal Medicine

## 2011-06-27 ENCOUNTER — Ambulatory Visit (INDEPENDENT_AMBULATORY_CARE_PROVIDER_SITE_OTHER)
Admission: RE | Admit: 2011-06-27 | Discharge: 2011-06-27 | Disposition: A | Discharge status: Home / Self Care | Payer: Medicare Other | Source: Ambulatory Visit | Attending: Internal Medicine | Admitting: Internal Medicine

## 2011-06-27 DIAGNOSIS — I10 Essential (primary) hypertension: Secondary | ICD-10-CM

## 2011-06-27 DIAGNOSIS — M1710 Unilateral primary osteoarthritis, unspecified knee: Secondary | ICD-10-CM

## 2011-06-27 DIAGNOSIS — D649 Anemia, unspecified: Secondary | ICD-10-CM

## 2011-06-27 DIAGNOSIS — N289 Disorder of kidney and ureter, unspecified: Secondary | ICD-10-CM

## 2011-06-27 DIAGNOSIS — R7309 Other abnormal glucose: Secondary | ICD-10-CM

## 2011-06-27 DIAGNOSIS — S79921A Unspecified injury of right thigh, initial encounter: Secondary | ICD-10-CM

## 2011-06-27 DIAGNOSIS — S79919A Unspecified injury of unspecified hip, initial encounter: Secondary | ICD-10-CM

## 2011-06-27 DIAGNOSIS — J309 Allergic rhinitis, unspecified: Secondary | ICD-10-CM

## 2011-06-27 LAB — COMPREHENSIVE METABOLIC PANEL
ALT: 28 U/L (ref 0–35)
AST: 35 U/L (ref 0–37)
Chloride: 108 mEq/L (ref 96–112)
Creatinine, Ser: 1.5 mg/dL — ABNORMAL HIGH (ref 0.4–1.2)
Sodium: 141 mEq/L (ref 135–145)
Total Bilirubin: 0.7 mg/dL (ref 0.3–1.2)
Total Protein: 8.4 g/dL — ABNORMAL HIGH (ref 6.0–8.3)

## 2011-06-27 LAB — CBC WITH DIFFERENTIAL/PLATELET
Basophils Absolute: 0 10*3/uL (ref 0.0–0.1)
HCT: 34.4 % — ABNORMAL LOW (ref 36.0–46.0)
Lymphs Abs: 0.9 10*3/uL (ref 0.7–4.0)
Monocytes Absolute: 0.3 10*3/uL (ref 0.1–1.0)
Monocytes Relative: 9.1 % (ref 3.0–12.0)
Neutrophils Relative %: 57.2 % (ref 43.0–77.0)
Platelets: 147 10*3/uL — ABNORMAL LOW (ref 150.0–400.0)
RDW: 12.7 % (ref 11.5–14.6)
WBC: 3.3 10*3/uL — ABNORMAL LOW (ref 4.5–10.5)

## 2011-06-27 LAB — HEMOGLOBIN A1C: Hgb A1c MFr Bld: 5.6 % (ref 4.6–6.5)

## 2011-06-27 MED ORDER — CITALOPRAM HYDROBROMIDE 10 MG PO TABS: 10.0000 mg | ORAL_TABLET | Freq: Every day | ORAL | Status: DC

## 2011-06-27 MED ORDER — FEXOFENADINE HCL 180 MG PO TABS: 180.0000 mg | ORAL_TABLET | Freq: Every day | ORAL | Status: DC

## 2011-06-27 MED ORDER — HYDROCODONE-ACETAMINOPHEN 7.5-500 MG PO TABS: 1.0000 | ORAL_TABLET | Freq: Four times a day (QID) | ORAL | Status: AC | PRN

## 2011-06-27 MED ORDER — METOPROLOL TARTRATE 25 MG PO TABS: 25.0000 mg | ORAL_TABLET | Freq: Every day | ORAL | Status: DC

## 2011-06-27 NOTE — Patient Instructions (Signed)
Hypertension (High Blood Pressure) As your heart beats, it forces blood through your arteries. This force is your blood pressure. If the pressure is too high, it is called hypertension (HTN) or high blood pressure. HTN is dangerous because you may have it and not know it. High blood pressure may mean that your heart has to work harder to pump blood. Your arteries may be narrow or stiff. The extra work puts you at risk for heart disease, stroke, and other problems.  Blood pressure consists of two numbers, a higher number over a lower, 110/72, for example. It is stated as "110 over 72." The ideal is below 120 for the top number (systolic) and under 80 for the bottom (diastolic). Write down your blood pressure today. You should pay close attention to your blood pressure if you have certain conditions such as:  Heart failure.  Prior heart attack.   Diabetes   Chronic kidney disease.   Prior stroke.   Multiple risk factors for heart disease.   To see if you have HTN, your blood pressure should be measured while you are seated with your arm held at the level of the heart. It should be measured at least twice. A one-time elevated blood pressure reading (especially in the Emergency Department) does not mean that you need treatment. There may be conditions in which the blood pressure is different between your right and left arms. It is important to see your caregiver soon for a recheck. Most people have essential hypertension which means that there is not a specific cause. This type of high blood pressure may be lowered by changing lifestyle factors such as:  Stress.  Smoking.   Lack of exercise.   Excessive weight.  Drug/tobacco/alcohol use.   Eating less salt.   Most people do not have symptoms from high blood pressure until it has caused damage to the body. Effective treatment can often prevent, delay or reduce that damage. TREATMENT Treatment for high blood pressure, when a cause has been  identified, is directed at the cause. There are a large number of medications to treat HTN. These fall into several categories, and your caregiver will help you select the medicines that are best for you. Medications may have side effects. You should review side effects with your caregiver. If your blood pressure stays high after you have made lifestyle changes or started on medicines,   Your medication(s) may need to be changed.   Other problems may need to be addressed.   Be certain you understand your prescriptions, and know how and when to take your medicine.   Be sure to follow up with your caregiver within the time frame advised (usually within two weeks) to have your blood pressure rechecked and to review your medications.   If you are taking more than one medicine to lower your blood pressure, make sure you know how and at what times they should be taken. Taking two medicines at the same time can result in blood pressure that is too low.  SEEK IMMEDIATE MEDICAL CARE IF YOU DEVELOP:  A severe headache, blurred or changing vision, or confusion.   Unusual weakness or numbness, or a faint feeling.   Severe chest or abdominal pain, vomiting, or breathing problems.  MAKE SURE YOU:   Understand these instructions.   Will watch your condition.   Will get help right away if you are not doing well or get worse.  Document Released: 11/19/2005 Document Re-Released: 05/09/2010 [employer] Patient Information 2011 [employer].Arthritis - Degenerative, Osteoarthritis You have osteoarthritis. This is the wear and tear arthritis that comes with aging. It is also called degenerative arthritis. This is common in people past middle age. It is caused by stress on the joints from living. The large weight bearing joints of the lower extremities are most often affected. The knees, hips, back, neck, and hands can become painful, swollen, and stiff. This is the most common type of arthritis. It comes on  with age, carrying too much weight, and from injury. Treatment includes resting the sore joint until the pain and swelling improve. Crutches or a walker may be needed for severe flares. Only take over-the-counter or prescription medicines for pain, discomfort, or fever as directed by your caregiver. Local heat therapy may improve motion. Cortisone shots into the joint are sometimes used to reduce pain and swelling during flares. Osteoarthritis is usually not crippling and progresses slowly. There are things you can do to decrease pain:  Avoid high impact activities.   Exercise regularly.   Low impact exercises such as walking, biking and swimming help to keep the muscles strong and keep normal joint function.   Stretching helps to keep your range of motion.   Lose weight if you are overweight. This reduces joint stress.  In severe cases when you have pain at rest or increasing disability, joint surgery may be helpful. See your caregiver for follow-up treatment as recommended.  SEEK IMMEDIATE MEDICAL CARE IF:  You have severe joint pain.   Marked swelling and redness in your joint develops.   You develop a high fever.  Document Released: 11/19/2005 Document Re-Released: 03/20/2008 [employer] Patient Information 2011 [employer]. 

## 2011-06-27 NOTE — Progress Notes (Signed)
Subjective:    Patient ID: Annette Mckay, female    DOB: August 12, 1927, 77 y.o.   MRN: [id number]  Injury The incident occurred 3 to 5 days ago. The incident occurred at home. The injury mechanism was a fall. Context: tripped on a carpet. No protective equipment was used. The pain is mild. It is unlikely that a foreign body is present. Associated symptoms include weakness ("all over"). Pertinent negatives include no abdominal pain, abnormal behavior, chest pain, coughing, difficulty breathing, fussiness, headaches, hearing loss, inability to bear weight, light-headedness, loss of consciousness, memory loss, nausea, neck pain, numbness, seizures, tingling, visual disturbance or vomiting. There have been no prior injuries to these areas. Her tetanus status is UTD.  Hypertension This is a chronic problem. The current episode started more than 1 year ago. The problem has been gradually improving since onset. Pertinent negatives include no anxiety, blurred vision, chest pain, headaches, malaise/fatigue, neck pain, orthopnea, palpitations, peripheral edema, PND, shortness of breath or sweats. There are no associated agents to hypertension. Past treatments include beta blockers, central alpha agonists, angiotensin blockers and diuretics. The current treatment provides significant improvement. Compliance problems include exercise and diet.  Hypertensive end-organ damage includes kidney disease.      Review of Systems  Constitutional: Positive for fatigue. Negative for fever, chills, malaise/fatigue, diaphoresis, activity change, appetite change and unexpected weight change.  HENT: Negative for hearing loss, sore throat, facial swelling, trouble swallowing, neck pain, neck stiffness, voice change and ear discharge.   Eyes: Negative for blurred vision, photophobia, redness and visual disturbance.  Respiratory: Negative for apnea, cough, choking, chest tightness, shortness of breath, wheezing and stridor.     Cardiovascular: Negative for chest pain, palpitations, orthopnea, leg swelling and PND.  Gastrointestinal: Negative for nausea, vomiting, abdominal pain, diarrhea, constipation, blood in stool, abdominal distention and anal bleeding.  Genitourinary: Negative for dysuria, urgency, frequency, hematuria, flank pain, decreased urine volume, enuresis, difficulty urinating and dyspareunia.  Musculoskeletal: Positive for arthralgias (right knee up to right hip). Negative for myalgias, back pain, joint swelling and gait problem.  Skin: Negative for color change, pallor, rash and wound.  Neurological: Positive for weakness ("all over"). Negative for dizziness, tingling, tremors, seizures, loss of consciousness, syncope, facial asymmetry, speech difficulty, light-headedness, numbness and headaches.  Hematological: Negative for adenopathy. Does not bruise/bleed easily.  Psychiatric/Behavioral: Negative for suicidal ideas, hallucinations, memory loss, behavioral problems, confusion, sleep disturbance, self-injury, dysphoric mood, decreased concentration and agitation. The patient is not nervous/anxious and is not hyperactive.        Objective:   Physical Exam  Vitals reviewed. Constitutional: She is oriented to person, place, and time. She appears well-developed and well-nourished. No distress.  HENT:  Head: Normocephalic and atraumatic.  Right Ear: External ear normal.  Left Ear: External ear normal.  Nose: Nose normal.  Mouth/Throat: Oropharynx is clear and moist. No oropharyngeal exudate.  Eyes: Conjunctivae and EOM are normal. Pupils are equal, round, and reactive to light. Right eye exhibits no discharge. Left eye exhibits no discharge. No scleral icterus.  Neck: Normal range of motion. Neck supple. No JVD present. No tracheal deviation present. No thyromegaly present.  Cardiovascular: Normal rate, regular rhythm, normal heart sounds and intact distal pulses.  Exam reveals no gallop and no friction  rub.   No murmur heard. Pulmonary/Chest: Effort normal and breath sounds normal. No stridor. No respiratory distress. She has no wheezes. She has no rales. She exhibits no tenderness.  Abdominal: Soft. Bowel sounds are normal. She exhibits no  distension and no mass. There is no tenderness. There is no rebound and no guarding.  Musculoskeletal: Normal range of motion. She exhibits no edema and no tenderness.       Right hip: Normal. She exhibits normal range of motion, normal strength, no tenderness, no bony tenderness, no swelling, no crepitus and no deformity.       Right knee: Normal. She exhibits normal range of motion, no swelling, no effusion, no ecchymosis, no deformity, no laceration, no erythema, normal alignment, no LCL laxity, normal patellar mobility and no bony tenderness.  Lymphadenopathy:    She has no cervical adenopathy.  Neurological: She is alert and oriented to person, place, and time. She has normal reflexes. She displays normal reflexes. No cranial nerve deficit. She exhibits normal muscle tone. Coordination normal.  Skin: Skin is warm and dry. No rash noted. She is not diaphoretic. No erythema. No pallor.  Psychiatric: She has a normal mood and affect. Her behavior is normal. Judgment and thought content normal.         Lab Results  Component Value Date   WBC 3.3* 06/27/2011   HGB 11.8* 06/27/2011   HCT 34.4* 06/27/2011   PLT 147.0* 06/27/2011   ALT 28 06/27/2011   AST 35 06/27/2011   NA 141 06/27/2011   K 4.4 06/27/2011   CL 108 06/27/2011   CREATININE 1.5* 06/27/2011   BUN 32* 06/27/2011   CO2 27 06/27/2011   TSH 0.87 06/27/2011   INR 1.9 05/29/2011   HGBA1C 5.6 06/27/2011   Assessment & Plan:

## 2011-06-28 ENCOUNTER — Telehealth: Payer: Self-pay

## 2011-06-28 ENCOUNTER — Encounter: Payer: Self-pay | Admitting: Internal Medicine

## 2011-06-28 NOTE — Telephone Encounter (Signed)
Pharmacy notified via fax.

## 2011-06-28 NOTE — Assessment & Plan Note (Signed)
The exam is normal, I will check a plain film to look for fracture

## 2011-06-28 NOTE — Assessment & Plan Note (Signed)
I will recheck her labs to see if the anemia has worsened

## 2011-06-28 NOTE — Assessment & Plan Note (Signed)
I will recheck her renal function today. °

## 2011-06-28 NOTE — Assessment & Plan Note (Signed)
She tells me that tramadol has been causing hallucinations and she wants to try vicodin for pain so I wrote an Rx for that today

## 2011-06-28 NOTE — Telephone Encounter (Signed)
BID would be great

## 2011-06-28 NOTE — Assessment & Plan Note (Signed)
I will recheck her A1C today 

## 2011-06-28 NOTE — Assessment & Plan Note (Signed)
Her BP is well controlled, I will check her lytes and renal function today 

## 2011-06-28 NOTE — Telephone Encounter (Signed)
Received fax from pharmacy regarding correct dosage for metoprolol tartrate 25mg . They received rx for 25 mg QD but states that pt has been taking this bid. Please advise which should be correct dosage Thanks

## 2011-07-24 ENCOUNTER — Telehealth: Payer: Self-pay | Admitting: *Deleted

## 2011-07-24 NOTE — Telephone Encounter (Signed)
Pt received letter from MD. She wants to know if lab order is ready - ok for labs only?

## 2011-07-25 NOTE — Telephone Encounter (Signed)
Appointment scheduled  07/26/2011

## 2011-07-25 NOTE — Telephone Encounter (Signed)
Left message for pt to callback office.  

## 2011-07-25 NOTE — Telephone Encounter (Signed)
She needs an OV with labs

## 2011-07-26 ENCOUNTER — Ambulatory Visit (INDEPENDENT_AMBULATORY_CARE_PROVIDER_SITE_OTHER): Payer: Medicare Other | Admitting: Internal Medicine

## 2011-07-26 ENCOUNTER — Encounter: Payer: Self-pay | Admitting: Internal Medicine

## 2011-07-26 ENCOUNTER — Other Ambulatory Visit (INDEPENDENT_AMBULATORY_CARE_PROVIDER_SITE_OTHER): Payer: Medicare Other

## 2011-07-26 DIAGNOSIS — D61818 Other pancytopenia: Secondary | ICD-10-CM | POA: Insufficient documentation

## 2011-07-26 DIAGNOSIS — I10 Essential (primary) hypertension: Secondary | ICD-10-CM

## 2011-07-26 DIAGNOSIS — D539 Nutritional anemia, unspecified: Secondary | ICD-10-CM

## 2011-07-26 LAB — CBC WITH DIFFERENTIAL/PLATELET
Basophils Relative: 0.7 % (ref 0.0–3.0)
Eosinophils Absolute: 0.1 10*3/uL (ref 0.0–0.7)
Hemoglobin: 11.5 g/dL — ABNORMAL LOW (ref 12.0–15.0)
Lymphs Abs: 0.9 10*3/uL (ref 0.7–4.0)
MCHC: 33.4 g/dL (ref 30.0–36.0)
MCV: 102.7 fl — ABNORMAL HIGH (ref 78.0–100.0)
Monocytes Absolute: 0.3 10*3/uL (ref 0.1–1.0)
Neutro Abs: 1.5 10*3/uL (ref 1.4–7.7)
RBC: 3.35 Mil/uL — ABNORMAL LOW (ref 3.87–5.11)

## 2011-07-26 LAB — IBC PANEL
Iron: 106 ug/dL (ref 42–145)
Saturation Ratios: 30.5 % (ref 20.0–50.0)
Transferrin: 248.3 mg/dL (ref 212.0–360.0)

## 2011-07-26 NOTE — Progress Notes (Signed)
Subjective:    Patient ID: Annette Mckay, female    DOB: 06/09/1927, 77 y.o.   MRN: [id number]  Anemia Presents for follow-up visit. Symptoms include malaise/fatigue. There has been no abdominal pain, anorexia, bruising/bleeding easily, confusion, fever, leg swelling, light-headedness, pallor, palpitations, paresthesias, pica or weight loss. Signs of blood loss that are not present include hematemesis, hematochezia, melena, menorrhagia and vaginal bleeding. There are no compliance problems.       Review of Systems  Constitutional: Positive for malaise/fatigue and fatigue. Negative for fever, chills, weight loss, diaphoresis, activity change, appetite change and unexpected weight change.  HENT: Negative.   Eyes: Negative.   Respiratory: Negative for apnea, cough, choking, chest tightness, shortness of breath, wheezing and stridor.   Cardiovascular: Negative for chest pain, palpitations and leg swelling.  Gastrointestinal: Negative for nausea, vomiting, abdominal pain, diarrhea, constipation, blood in stool, melena, hematochezia, abdominal distention, anal bleeding, anorexia and hematemesis.  Genitourinary: Negative for dysuria, urgency, frequency, hematuria, flank pain, decreased urine volume, vaginal bleeding, enuresis, difficulty urinating, dyspareunia and menorrhagia.  Musculoskeletal: Negative for myalgias, back pain, joint swelling, arthralgias and gait problem.  Skin: Negative for color change, pallor, rash and wound.  Neurological: Negative for dizziness, tremors, seizures, syncope, facial asymmetry, speech difficulty, weakness, light-headedness, numbness, headaches and paresthesias.  Hematological: Negative for adenopathy. Does not bruise/bleed easily.  Psychiatric/Behavioral: Negative for hallucinations, behavioral problems, confusion, sleep disturbance, self-injury, dysphoric mood, decreased concentration and agitation. The patient is not nervous/anxious and is not hyperactive.        Objective:   Physical Exam  Vitals reviewed. Constitutional: She is oriented to person, place, and time. She appears well-developed and well-nourished. No distress.  HENT:  Head: Normocephalic and atraumatic.  Right Ear: External ear normal.  Left Ear: External ear normal.  Nose: Nose normal.  Mouth/Throat: Oropharynx is clear and moist. No oropharyngeal exudate.  Eyes: Conjunctivae and EOM are normal. Pupils are equal, round, and reactive to light. Right eye exhibits no discharge. Left eye exhibits no discharge. No scleral icterus.  Neck: Normal range of motion. Neck supple. No JVD present. No tracheal deviation present. No thyromegaly present.  Cardiovascular: Normal rate, regular rhythm, normal heart sounds and intact distal pulses.  Exam reveals no gallop and no friction rub.   No murmur heard. Pulmonary/Chest: Effort normal and breath sounds normal. No stridor. No respiratory distress. She has no wheezes. She has no rales. She exhibits no tenderness.  Abdominal: Soft. Bowel sounds are normal. She exhibits no distension and no mass. There is no tenderness. There is no rebound and no guarding.  Musculoskeletal: Normal range of motion. She exhibits no edema and no tenderness.  Lymphadenopathy:    She has no cervical adenopathy.  Neurological: She is alert and oriented to person, place, and time. She has normal reflexes. She displays normal reflexes. No cranial nerve deficit. She exhibits normal muscle tone. Coordination normal.  Skin: Skin is warm and dry. No rash noted. She is not diaphoretic. No erythema. No pallor.  Psychiatric: She has a normal mood and affect. Her behavior is normal. Judgment and thought content normal.      Lab Results  Component Value Date   WBC 3.3* 06/27/2011   HGB 11.8* 06/27/2011   HCT 34.4* 06/27/2011   PLT 147.0* 06/27/2011   ALT 28 06/27/2011   AST 35 06/27/2011   NA 141 06/27/2011   K 4.4 06/27/2011   CL 108 06/27/2011   CREATININE 1.5* 06/27/2011    BUN 32* 06/27/2011  CO2 27 06/27/2011   TSH 0.87 06/27/2011   INR 1.9 05/29/2011   HGBA1C 5.6 06/27/2011      Assessment & Plan:

## 2011-07-26 NOTE — Assessment & Plan Note (Signed)
I will repeat her CBC today and will get some other labs done as well to look for secondary causes of bone marrow failure

## 2011-07-26 NOTE — Patient Instructions (Signed)
Aplastic Anemia Aplastic anemia occurs when the soft material that makes up the hollow insides of your bones (bone marrow) stops making enough blood cells. These cells are:  Red cells to carry oxygen.   White cells to fight infection.   Platelets to help your blood clot when you have an injury.  Your bone marrow continually makes new blood cells because they do not last very long. Red blood cells live about four months. Platelets last only one week and white blood cells last about a day. Anything that hurts or injures your bone marrow can cause aplastic anemia. Aplastic anemia affects all age groups although it seems to appear a little more frequently in children. For unknown reasons, it also appears in people aged 27-25 and those over age 53. It usually develops slowly. In a few cases, symptoms can develop very quickly. There are many possible causes. Even after aggressive treatment, as many as 1 in 4 patients can die within the first year. Many are treated successfully but must be monitored for possible recurrent problems. Aplastic anemia is a rare and serious condition.  CAUSES Some things that injure marrow can include:  Radiation and chemotherapy treatment for cancer. These treatments used to kill cancer cells also damage other cells.   Exposure to toxic chemicals used in some pesticides and insecticides may damage marrow.   Some medications, such as those used to treat rheumatoid arthritis and some antibiotics, can cause secondary aplastic anemia.   Autoimmune disorders in which your immune system begins attacking your own body cells.   Viral infections can affect bone marrow.   Pregnancy   Idiopathic aplastic anemia makes up about half the cases of aplastic anemia. This means the cause is unknown.  SYMPTOMS Red blood cells carry oxygen. A decrease in red blood cells will make you short of breath. White blood cells fight infection. Decreases in white blood cells make you more likely  to get an infection. Platelets help your blood clot. Too few platelets can cause bleeding.  Other common signs and symptoms include:  Fatigue.  Light headedness or fainting.   Shortness of breath and rapid heart rate with exertion.   Pale skin and lips.   Frequent infections.  Easy bruising and bleeding.   Nosebleeds and bleeding gums.   Prolonged bleeding from cuts.   Sore mouth.   DIAGNOSIS  Blood tests and a bone marrow biopsy usually are used to find out what is wrong.   A number of different problems can make one of your blood cells low, but when they are all low, it is more worrisome.   Additional testing may be done to find the underlying cause for the anemia.  TREATMENT You will usually be referred to a specialist in blood diseases (hematologist) or to a treatment center which specializes in the treatment of aplastic anemia. Severe aplastic anemia is life-threatening. You will need to be hospitalized. Mild or moderate aplastic anemia is still serious, but you may be treated as an outpatient unless complications develop. Treatments may include:  Observation for mild cases.   Blood transfusions.   Medications. When anemia is from an autoimmune disorder, medications may be used to suppress the immune system. Medications are also available to stimulate marrow to make more blood cells.   Bone marrow transplantation. This is a procedure where healthy marrow from a donor is given to the person with aplastic anemia. This is used for severe aplastic anemia. If a donor is found, the marrow that  you have left is depleted with radiation. This is done so the remaining marrow does not try to fight against the healthy donor marrow. The healthy marrow is given into a vein and it goes to the bone marrow cavities where it will hopefully begin producing new blood cells.  This procedure carries risk. If the body rejects the transplant, it can be life-threatening. Not everyone is a candidate  for transplantation. It requires a lengthy stay in the hospital. After the transplant, drugs to help prevent rejection are necessary. One other risk is the danger of catching a disease from the donor. Today the blood supply is the safest it has ever been. The risk is extremely small but still remains. HOME CARE INSTRUCTIONS  Get plenty of rest and eat a well balanced diet.   Avoid excessive exercise. Long-term anemia can stress the heart.   When platelets are at low levels and the risk of bleeding is greater, avoid all activities that risk injury.   Protect yourself from infections by washing your hands often. Avoid crowds. Avoid being around sick people.  PREVENTION Especially if you have had this problem before, avoid exposure to insecticides, herbicides, organic solvents, paint removers and other toxic chemicals. SEEK IMMEDIATE MEDICAL CARE IF YOU DEVELOP:  A temperature above 100 F (37.8 C).   Flu-like feelings, signs of infection or more frequent infections.   Blood in your urine or bowel movements.   Easy bruising or bleeding from your gums or nose.   Prolonged bleeding from cuts.   Increasing shortness of breath or chest pain with exertion.   A rapid heart rate with exertion.   Increasing fatigue and tiredness.   Light headedness or fainting.   Pale skin and lips.   A sore mouth.  MAKE SURE YOU:   Understand these instructions.   Will watch your condition.   Will get help right away if you are not doing well or get worse.  Document Released: 09/16/2007 Document Re-Released: 02/13/2010 [facility] Patient Information 2011 [place], [place].

## 2011-07-30 ENCOUNTER — Telehealth: Payer: Self-pay | Admitting: *Deleted

## 2011-07-30 ENCOUNTER — Other Ambulatory Visit: Payer: Self-pay | Admitting: Internal Medicine

## 2011-07-30 LAB — PROTEIN ELECTROPHORESIS, SERUM
Albumin ELP: 51.2 % — ABNORMAL LOW (ref 55.8–66.1)
Alpha-1-Globulin: 4 % (ref 2.9–4.9)
Alpha-2-Globulin: 11.2 % (ref 7.1–11.8)
Beta 2: 6.1 % (ref 3.2–6.5)
Beta Globulin: 5.9 % (ref 4.7–7.2)
Gamma Globulin: 21.6 % — ABNORMAL HIGH (ref 11.1–18.8)
Total Protein, Serum Electrophoresis: 8 g/dL (ref 6.0–8.3)

## 2011-07-30 NOTE — Telephone Encounter (Signed)
Patient requesting results of labs.  

## 2011-07-30 NOTE — Telephone Encounter (Signed)
Looks better so far

## 2011-07-30 NOTE — Telephone Encounter (Signed)
Patient informed. 

## 2011-08-29 ENCOUNTER — Other Ambulatory Visit: Payer: Self-pay | Admitting: Internal Medicine

## 2011-09-24 ENCOUNTER — Other Ambulatory Visit: Payer: Self-pay | Admitting: Cardiovascular Disease

## 2011-10-01 ENCOUNTER — Encounter: Payer: Self-pay | Admitting: Internal Medicine

## 2011-10-01 ENCOUNTER — Ambulatory Visit (INDEPENDENT_AMBULATORY_CARE_PROVIDER_SITE_OTHER): Payer: Medicare Other | Admitting: *Deleted

## 2011-10-01 ENCOUNTER — Ambulatory Visit (INDEPENDENT_AMBULATORY_CARE_PROVIDER_SITE_OTHER): Payer: Medicare Other | Admitting: Internal Medicine

## 2011-10-01 ENCOUNTER — Other Ambulatory Visit (INDEPENDENT_AMBULATORY_CARE_PROVIDER_SITE_OTHER): Payer: Medicare Other

## 2011-10-01 VITALS — BP 120/68 | HR 61 | Temp 98.0°F | Resp 14 | Wt 187.5 lb

## 2011-10-01 DIAGNOSIS — R7309 Other abnormal glucose: Secondary | ICD-10-CM

## 2011-10-01 DIAGNOSIS — N3281 Overactive bladder: Secondary | ICD-10-CM

## 2011-10-01 DIAGNOSIS — R32 Unspecified urinary incontinence: Secondary | ICD-10-CM

## 2011-10-01 DIAGNOSIS — N289 Disorder of kidney and ureter, unspecified: Secondary | ICD-10-CM

## 2011-10-01 DIAGNOSIS — D61818 Other pancytopenia: Secondary | ICD-10-CM

## 2011-10-01 DIAGNOSIS — I10 Essential (primary) hypertension: Secondary | ICD-10-CM

## 2011-10-01 DIAGNOSIS — Z7901 Long term (current) use of anticoagulants: Secondary | ICD-10-CM

## 2011-10-01 DIAGNOSIS — I82409 Acute embolism and thrombosis of unspecified deep veins of unspecified lower extremity: Secondary | ICD-10-CM

## 2011-10-01 DIAGNOSIS — N318 Other neuromuscular dysfunction of bladder: Secondary | ICD-10-CM

## 2011-10-01 LAB — CBC WITH DIFFERENTIAL/PLATELET
Basophils Absolute: 0 10*3/uL (ref 0.0–0.1)
Hemoglobin: 11.6 g/dL — ABNORMAL LOW (ref 12.0–15.0)
Lymphocytes Relative: 28.2 % (ref 12.0–46.0)
Monocytes Relative: 7.6 % (ref 3.0–12.0)
Neutro Abs: 1.7 10*3/uL (ref 1.4–7.7)
RBC: 3.25 Mil/uL — ABNORMAL LOW (ref 3.87–5.11)
RDW: 13.1 % (ref 11.5–14.6)

## 2011-10-01 LAB — COMPREHENSIVE METABOLIC PANEL
ALT: 27 U/L (ref 0–35)
AST: 37 U/L (ref 0–37)
Albumin: 4 g/dL (ref 3.5–5.2)
BUN: 51 mg/dL — ABNORMAL HIGH (ref 6–23)
Calcium: 10.2 mg/dL (ref 8.4–10.5)
Chloride: 107 mEq/L (ref 96–112)
Potassium: 4.6 mEq/L (ref 3.5–5.1)
Total Protein: 7.7 g/dL (ref 6.0–8.3)

## 2011-10-01 LAB — URINALYSIS, ROUTINE W REFLEX MICROSCOPIC
Bilirubin Urine: NEGATIVE
Hgb urine dipstick: NEGATIVE
Ketones, ur: NEGATIVE
Urine Glucose: NEGATIVE
Urobilinogen, UA: 1 (ref 0.0–1.0)

## 2011-10-01 MED ORDER — SOLIFENACIN SUCCINATE 5 MG PO TABS: 5.0000 mg | ORAL_TABLET | Freq: Every day | ORAL | Status: DC

## 2011-10-01 NOTE — Assessment & Plan Note (Signed)
I will recheck her CBC today 

## 2011-10-01 NOTE — Assessment & Plan Note (Signed)
I have asked her to see urology to see if this can be treated

## 2011-10-01 NOTE — Patient Instructions (Signed)

## 2011-10-01 NOTE — Assessment & Plan Note (Signed)
I gave her samples of vesicare and will see if that helps her

## 2011-10-01 NOTE — Progress Notes (Signed)
Subjective:    Patient ID: Annette Mckay, female    DOB: 11/12/1927, 77 y.o.   MRN: [id number]  Anemia Presents for follow-up visit. There has been no abdominal pain, anorexia, bruising/bleeding easily, confusion, fever, leg swelling, light-headedness, malaise/fatigue, pallor, palpitations, paresthesias, pica or weight loss. Signs of blood loss that are not present include hematemesis, hematochezia, melena, menorrhagia and vaginal bleeding. There are no compliance problems.  Side effects of medications include fatigue.      Review of Systems  Constitutional: Positive for fatigue. Negative for fever, chills, weight loss, malaise/fatigue, diaphoresis, activity change, appetite change and unexpected weight change.  HENT: Negative.   Eyes: Negative.   Respiratory: Negative for apnea, cough, choking, chest tightness, shortness of breath, wheezing and stridor.   Cardiovascular: Negative for chest pain, palpitations and leg swelling.  Gastrointestinal: Negative for nausea, vomiting, abdominal pain, diarrhea, constipation, melena, hematochezia, anorexia and hematemesis.  Genitourinary: Positive for frequency and enuresis. Negative for dysuria, urgency, hematuria, flank pain, decreased urine volume, vaginal bleeding, vaginal discharge, difficulty urinating, genital sores, vaginal pain, menstrual problem, pelvic pain, dyspareunia and menorrhagia.  Musculoskeletal: Negative for myalgias, back pain, joint swelling, arthralgias and gait problem.  Skin: Negative for color change, pallor, rash and wound.  Neurological: Negative.  Negative for light-headedness and paresthesias.  Hematological: Negative for adenopathy. Does not bruise/bleed easily.  Psychiatric/Behavioral: Negative for suicidal ideas, hallucinations, behavioral problems, confusion, sleep disturbance, self-injury, dysphoric mood, decreased concentration and agitation. The patient is not nervous/anxious and is not hyperactive.          Objective:   Physical Exam  Vitals reviewed. Constitutional: She is oriented to person, place, and time. She appears well-developed and well-nourished. No distress.  HENT:  Head: Normocephalic and atraumatic.  Mouth/Throat: Oropharynx is clear and moist. No oropharyngeal exudate.  Eyes: Conjunctivae are normal. Right eye exhibits no discharge. Left eye exhibits no discharge. No scleral icterus.  Neck: Normal range of motion. Neck supple. No JVD present. No tracheal deviation present. No thyromegaly present.  Cardiovascular: Normal rate, regular rhythm, normal heart sounds and intact distal pulses.  Exam reveals no gallop and no friction rub.   No murmur heard. Pulmonary/Chest: Effort normal and breath sounds normal. No stridor. No respiratory distress. She has no wheezes. She has no rales. She exhibits no tenderness.  Abdominal: Soft. Bowel sounds are normal. She exhibits no distension and no mass. There is no tenderness. There is no rebound and no guarding.  Musculoskeletal: Normal range of motion. She exhibits edema (trace edema in both legs). She exhibits no tenderness.  Lymphadenopathy:    She has no cervical adenopathy.  Neurological: She is alert and oriented to person, place, and time. She has normal reflexes. She displays normal reflexes. She exhibits normal muscle tone.  Skin: Skin is warm and dry. No rash noted. She is not diaphoretic. No erythema. No pallor.  Psychiatric: She has a normal mood and affect. Her behavior is normal. Judgment and thought content normal.      Lab Results  Component Value Date   WBC 2.8* 07/26/2011   HGB 11.5* 07/26/2011   HCT 34.4* 07/26/2011   PLT 146.0* 07/26/2011   GLUCOSE 91 06/27/2011   ALT 28 06/27/2011   AST 35 06/27/2011   NA 141 06/27/2011   K 4.4 06/27/2011   CL 108 06/27/2011   CREATININE 1.5* 06/27/2011   BUN 32* 06/27/2011   CO2 27 06/27/2011   TSH 0.87 06/27/2011   INR 1.9 05/29/2011   HGBA1C 5.6 06/27/2011  Assessment & Plan:

## 2011-10-01 NOTE — Assessment & Plan Note (Signed)
Her BP is well controlled, I will check her lytes and renal function 

## 2011-10-01 NOTE — Assessment & Plan Note (Signed)
I will check her a1c 

## 2011-10-17 ENCOUNTER — Encounter: Payer: Self-pay | Admitting: Internal Medicine

## 2011-10-17 ENCOUNTER — Ambulatory Visit (INDEPENDENT_AMBULATORY_CARE_PROVIDER_SITE_OTHER): Payer: Medicare Other | Admitting: Internal Medicine

## 2011-10-17 DIAGNOSIS — I10 Essential (primary) hypertension: Secondary | ICD-10-CM

## 2011-10-17 DIAGNOSIS — N3281 Overactive bladder: Secondary | ICD-10-CM

## 2011-10-17 DIAGNOSIS — N289 Disorder of kidney and ureter, unspecified: Secondary | ICD-10-CM

## 2011-10-17 DIAGNOSIS — N318 Other neuromuscular dysfunction of bladder: Secondary | ICD-10-CM

## 2011-10-17 DIAGNOSIS — D61818 Other pancytopenia: Secondary | ICD-10-CM

## 2011-10-17 NOTE — Assessment & Plan Note (Signed)
Her BP is well controlled 

## 2011-10-17 NOTE — Patient Instructions (Signed)

## 2011-10-17 NOTE — Assessment & Plan Note (Signed)
Stop the lasix since she is dehydrated and has renal insufficiency, she sees Nephrology in 2 weeks

## 2011-10-17 NOTE — Assessment & Plan Note (Signed)
Her CBC is stable, her SPEP was negative, I am concerned the lasix may be suppressing the BM so I have asked her to stop it

## 2011-10-17 NOTE — Progress Notes (Signed)
  Subjective:    Patient ID: Annette Mckay, female    DOB: [birth year], 77 y.o.   MRN: [id number]  Hypertension This is a chronic problem. The current episode started more than 1 year ago. The problem has been gradually improving since onset. The problem is controlled. Pertinent negatives include no anxiety, blurred vision, chest pain, headaches, malaise/fatigue, neck pain, orthopnea, palpitations, peripheral edema, PND, shortness of breath or sweats. There are no associated agents to hypertension. Past treatments include central alpha agonists, calcium channel blockers, angiotensin blockers and diuretics. The current treatment provides moderate improvement. Compliance problems include exercise and diet.       Review of Systems  Constitutional: Negative for fever, chills, malaise/fatigue, diaphoresis, activity change, appetite change, fatigue and unexpected weight change.  HENT: Negative.  Negative for neck pain.   Eyes: Negative.  Negative for blurred vision.  Respiratory: Negative for cough, shortness of breath, wheezing and stridor.   Cardiovascular: Negative for chest pain, palpitations, orthopnea, leg swelling and PND.  Gastrointestinal: Negative for vomiting, abdominal pain, diarrhea and constipation.  Genitourinary: Positive for urgency and frequency. Negative for dysuria, hematuria, flank pain, enuresis, difficulty urinating and dyspareunia.  Musculoskeletal: Negative.  Negative for myalgias, back pain, joint swelling, arthralgias and gait problem.  Skin: Negative for color change, pallor, rash and wound.  Neurological: Negative for dizziness, tremors, seizures, syncope, facial asymmetry, speech difficulty, weakness, light-headedness, numbness and headaches.  Hematological: Negative for adenopathy. Does not bruise/bleed easily.  Psychiatric/Behavioral: Negative.        Objective:   Physical Exam  Vitals reviewed. Constitutional: She is oriented to person, place, and time. She  appears well-developed and well-nourished. No distress.  HENT:  Head: Normocephalic and atraumatic.  Mouth/Throat: Oropharynx is clear and moist. No oropharyngeal exudate.  Eyes: Conjunctivae are normal. Right eye exhibits no discharge. No scleral icterus.  Neck: Normal range of motion. Neck supple. No JVD present. No tracheal deviation present. No thyromegaly present.  Cardiovascular: Normal rate, regular rhythm, normal heart sounds and intact distal pulses.  Exam reveals no gallop and no friction rub.   No murmur heard. Pulmonary/Chest: Effort normal and breath sounds normal. No stridor. No respiratory distress. She has no wheezes. She has no rales. She exhibits no tenderness.  Abdominal: Soft. Bowel sounds are normal. She exhibits no distension and no mass. There is no tenderness. There is no rebound and no guarding.  Musculoskeletal: Normal range of motion. She exhibits no edema and no tenderness.  Lymphadenopathy:    She has no cervical adenopathy.  Neurological: She is oriented to person, place, and time.  Skin: Skin is warm and dry. No rash noted. She is not diaphoretic. No erythema. No pallor.  Psychiatric: She has a normal mood and affect. Her behavior is normal. Judgment and thought content normal.      Lab Results  Component Value Date   WBC 2.7* 10/01/2011   HGB 11.6* 10/01/2011   HCT 33.5* 10/01/2011   PLT 153.0 10/01/2011   GLUCOSE 97 10/01/2011   ALT 27 10/01/2011   AST 37 10/01/2011   NA 139 10/01/2011   K 4.6 10/01/2011   CL 107 10/01/2011   CREATININE 2.0* 10/01/2011   BUN 51* 10/01/2011   CO2 25 10/01/2011   TSH 0.87 06/27/2011   INR 2.2 10/01/2011   HGBA1C 5.5 10/01/2011      Assessment & Plan:

## 2011-10-17 NOTE — Assessment & Plan Note (Signed)
She tells me that has improved on Vesicare

## 2011-10-29 ENCOUNTER — Telehealth: Payer: Self-pay

## 2011-10-29 MED ORDER — CLONIDINE HCL 0.2 MG PO TABS: 0.2000 mg | ORAL_TABLET | Freq: Every day | ORAL | Status: DC

## 2011-10-29 MED ORDER — CLONIDINE HCL 0.2 MG PO TABS: 0.2000 mg | ORAL_TABLET | Freq: Two times a day (BID) | ORAL | Status: DC

## 2011-10-29 NOTE — Telephone Encounter (Signed)
Received refill request from [facility] for  Clonidine 0.2mg  BID. Please advise if ok to refill, pt last seen 10/17/11

## 2011-10-29 NOTE — Telephone Encounter (Signed)
Per pharmacy, pt has been on clonidine 0.2 BID and need rx sent in

## 2011-10-29 NOTE — Telephone Encounter (Signed)
yes

## 2011-11-12 ENCOUNTER — Ambulatory Visit (INDEPENDENT_AMBULATORY_CARE_PROVIDER_SITE_OTHER): Payer: Medicare Other | Admitting: *Deleted

## 2011-11-12 DIAGNOSIS — Z7901 Long term (current) use of anticoagulants: Secondary | ICD-10-CM

## 2011-11-12 DIAGNOSIS — I82409 Acute embolism and thrombosis of unspecified deep veins of unspecified lower extremity: Secondary | ICD-10-CM

## 2011-11-12 LAB — POCT INR: INR: 2.3

## 2011-11-19 ENCOUNTER — Other Ambulatory Visit: Payer: Self-pay | Admitting: Internal Medicine

## 2011-12-18 ENCOUNTER — Telehealth: Payer: Self-pay

## 2011-12-18 MED ORDER — HYDROCODONE-ACETAMINOPHEN 7.5-500 MG PO TABS: 1.0000 | ORAL_TABLET | ORAL | Status: DC | PRN

## 2011-12-18 NOTE — Telephone Encounter (Signed)
yes

## 2011-12-18 NOTE — Telephone Encounter (Signed)
Please advise if ok to refill hydrocodone 7.5-500 for this patient, last seen 10/17/11

## 2011-12-24 ENCOUNTER — Ambulatory Visit (INDEPENDENT_AMBULATORY_CARE_PROVIDER_SITE_OTHER): Payer: Medicare Other | Admitting: *Deleted

## 2011-12-24 DIAGNOSIS — Z7901 Long term (current) use of anticoagulants: Secondary | ICD-10-CM

## 2011-12-24 DIAGNOSIS — I82409 Acute embolism and thrombosis of unspecified deep veins of unspecified lower extremity: Secondary | ICD-10-CM

## 2011-12-24 LAB — POCT INR: INR: 3.6

## 2012-01-14 ENCOUNTER — Ambulatory Visit (INDEPENDENT_AMBULATORY_CARE_PROVIDER_SITE_OTHER): Payer: Medicare Other | Admitting: *Deleted

## 2012-01-14 DIAGNOSIS — I82409 Acute embolism and thrombosis of unspecified deep veins of unspecified lower extremity: Secondary | ICD-10-CM

## 2012-01-14 DIAGNOSIS — Z7901 Long term (current) use of anticoagulants: Secondary | ICD-10-CM

## 2012-01-15 ENCOUNTER — Encounter: Payer: Medicare Other | Admitting: *Deleted

## 2012-01-22 ENCOUNTER — Other Ambulatory Visit: Payer: Self-pay

## 2012-01-22 MED ORDER — WARFARIN SODIUM 5 MG PO TABS: 5.0000 mg | ORAL_TABLET | ORAL | Status: DC

## 2012-02-13 ENCOUNTER — Ambulatory Visit (INDEPENDENT_AMBULATORY_CARE_PROVIDER_SITE_OTHER)
Admission: RE | Admit: 2012-02-13 | Discharge: 2012-02-13 | Disposition: A | Discharge status: Home / Self Care | Payer: Medicare Other | Source: Ambulatory Visit | Attending: Internal Medicine | Admitting: Internal Medicine

## 2012-02-13 ENCOUNTER — Other Ambulatory Visit (INDEPENDENT_AMBULATORY_CARE_PROVIDER_SITE_OTHER): Payer: Medicare Other

## 2012-02-13 ENCOUNTER — Ambulatory Visit (INDEPENDENT_AMBULATORY_CARE_PROVIDER_SITE_OTHER): Payer: Medicare Other | Admitting: Internal Medicine

## 2012-02-13 ENCOUNTER — Encounter: Payer: Self-pay | Admitting: Internal Medicine

## 2012-02-13 VITALS — BP 120/70 | HR 70 | Temp 98.8°F | Resp 16 | Wt 191.0 lb

## 2012-02-13 DIAGNOSIS — M79609 Pain in unspecified limb: Secondary | ICD-10-CM

## 2012-02-13 DIAGNOSIS — E7800 Pure hypercholesterolemia, unspecified: Secondary | ICD-10-CM

## 2012-02-13 DIAGNOSIS — D61818 Other pancytopenia: Secondary | ICD-10-CM

## 2012-02-13 DIAGNOSIS — D510 Vitamin B12 deficiency anemia due to intrinsic factor deficiency: Secondary | ICD-10-CM

## 2012-02-13 DIAGNOSIS — I10 Essential (primary) hypertension: Secondary | ICD-10-CM

## 2012-02-13 DIAGNOSIS — M79671 Pain in right foot: Secondary | ICD-10-CM

## 2012-02-13 DIAGNOSIS — N289 Disorder of kidney and ureter, unspecified: Secondary | ICD-10-CM

## 2012-02-13 DIAGNOSIS — Z79899 Other long term (current) drug therapy: Secondary | ICD-10-CM

## 2012-02-13 DIAGNOSIS — Z7901 Long term (current) use of anticoagulants: Secondary | ICD-10-CM

## 2012-02-13 DIAGNOSIS — R7309 Other abnormal glucose: Secondary | ICD-10-CM

## 2012-02-13 DIAGNOSIS — I82409 Acute embolism and thrombosis of unspecified deep veins of unspecified lower extremity: Secondary | ICD-10-CM

## 2012-02-13 DIAGNOSIS — D649 Anemia, unspecified: Secondary | ICD-10-CM

## 2012-02-13 LAB — PROTIME-INR: Prothrombin Time: 28.1 s — ABNORMAL HIGH (ref 10.2–12.4)

## 2012-02-13 LAB — CBC WITH DIFFERENTIAL/PLATELET
Basophils Absolute: 0 10*3/uL (ref 0.0–0.1)
Basophils Relative: 0.7 % (ref 0.0–3.0)
HCT: 33.9 % — ABNORMAL LOW (ref 36.0–46.0)
Hemoglobin: 11.3 g/dL — ABNORMAL LOW (ref 12.0–15.0)
Lymphocytes Relative: 33.5 % (ref 12.0–46.0)
Lymphs Abs: 1 10*3/uL (ref 0.7–4.0)
Monocytes Relative: 11.1 % (ref 3.0–12.0)
Neutro Abs: 1.6 10*3/uL (ref 1.4–7.7)
RBC: 3.31 Mil/uL — ABNORMAL LOW (ref 3.87–5.11)
RDW: 12.7 % (ref 11.5–14.6)

## 2012-02-13 LAB — HEMOGLOBIN A1C: Hgb A1c MFr Bld: 5.3 % (ref 4.6–6.5)

## 2012-02-13 LAB — URINALYSIS, ROUTINE W REFLEX MICROSCOPIC
Bilirubin Urine: NEGATIVE
Ketones, ur: NEGATIVE
Specific Gravity, Urine: 1.025 (ref 1.000–1.030)
Total Protein, Urine: NEGATIVE
Urine Glucose: NEGATIVE
pH: 5.5 (ref 5.0–8.0)

## 2012-02-13 LAB — URIC ACID: Uric Acid, Serum: 5.9 mg/dL (ref 2.4–7.0)

## 2012-02-13 LAB — COMPREHENSIVE METABOLIC PANEL
ALT: 32 U/L (ref 0–35)
BUN: 49 mg/dL — ABNORMAL HIGH (ref 6–23)
CO2: 28 mEq/L (ref 19–32)
Calcium: 10.4 mg/dL (ref 8.4–10.5)
Chloride: 102 mEq/L (ref 96–112)
Creatinine, Ser: 1.9 mg/dL — ABNORMAL HIGH (ref 0.4–1.2)
GFR: 32.27 mL/min — ABNORMAL LOW (ref >60.00)
Glucose, Bld: 91 mg/dL (ref 70–99)
Total Bilirubin: 0.3 mg/dL (ref 0.3–1.2)

## 2012-02-13 LAB — IBC PANEL: Iron: 82 ug/dL (ref 42–145)

## 2012-02-13 MED ORDER — OXYCODONE-ACETAMINOPHEN 7.5-325 MG PO TABS: 1.0000 | ORAL_TABLET | ORAL | Status: AC | PRN

## 2012-02-13 NOTE — Assessment & Plan Note (Signed)
Recheck her CBC today and look at her vitamin levels as well

## 2012-02-13 NOTE — Assessment & Plan Note (Signed)
i will check her CBC and her vitamin levels today

## 2012-02-13 NOTE — Assessment & Plan Note (Signed)
Check her d-dimer and PT-INR today

## 2012-02-13 NOTE — Assessment & Plan Note (Signed)
Check the BMP and UA today

## 2012-02-13 NOTE — Assessment & Plan Note (Signed)
CBC today and order a f/up with hematology

## 2012-02-13 NOTE — Assessment & Plan Note (Signed)
FLP today 

## 2012-02-13 NOTE — Assessment & Plan Note (Signed)
a1c today 

## 2012-02-13 NOTE — Assessment & Plan Note (Addendum)
I will check an xray to look for inflammatory arthritis, occult fracture, spurring, etc. Also will check a uric acid level for possible gout - she tells me that hydrocodone does not control her pain so I wrote for percocet instead

## 2012-02-13 NOTE — Progress Notes (Signed)
Subjective:    Patient ID: Annette Mckay, female    DOB: 05-09-1927, 78 y.o.   MRN: [id number]  Anemia Presents for follow-up visit. Symptoms include confusion, leg swelling and malaise/fatigue. There has been no abdominal pain, anorexia, bruising/bleeding easily, fever, light-headedness, pallor, palpitations, paresthesias, pica or weight loss. Signs of blood loss that are not present include hematemesis, hematochezia, melena and vaginal bleeding.      Review of Systems  Constitutional: Positive for malaise/fatigue. Negative for fever, chills, weight loss, diaphoresis, activity change, appetite change, fatigue and unexpected weight change.  HENT: Negative.   Eyes: Negative.   Respiratory: Negative for apnea, cough, choking, chest tightness, shortness of breath, wheezing and stridor.   Cardiovascular: Positive for leg swelling. Negative for chest pain and palpitations.  Gastrointestinal: Negative for nausea, vomiting, abdominal pain, diarrhea, constipation, blood in stool, melena, hematochezia, abdominal distention, anal bleeding, anorexia and hematemesis.  Genitourinary: Negative for dysuria, urgency, frequency, hematuria, flank pain, decreased urine volume, vaginal bleeding, enuresis, difficulty urinating and dyspareunia.  Musculoskeletal: Positive for arthralgias (right foot pain and swelling for one week, right calf pain as well). Negative for myalgias, back pain, joint swelling and gait problem.  Skin: Negative for color change, pallor, rash and wound.  Neurological: Negative for dizziness, tremors, seizures, syncope, facial asymmetry, speech difficulty, weakness, light-headedness, numbness, headaches and paresthesias.  Hematological: Negative for adenopathy. Does not bruise/bleed easily.  Psychiatric/Behavioral: Positive for behavioral problems, confusion and decreased concentration. Negative for suicidal ideas, hallucinations, sleep disturbance, self-injury, dysphoric mood and agitation.  The patient is not nervous/anxious and is not hyperactive.        Objective:   Physical Exam  Vitals reviewed. Constitutional: She is oriented to person, place, and time. She appears well-developed and well-nourished. No distress.  HENT:  Head: Normocephalic and atraumatic.  Mouth/Throat: Oropharynx is clear and moist. No oropharyngeal exudate.  Eyes: Conjunctivae are normal. Right eye exhibits no discharge. Left eye exhibits no discharge. No scleral icterus.  Neck: Normal range of motion. Neck supple. No JVD present. No tracheal deviation present. No thyromegaly present.  Cardiovascular: Normal rate, regular rhythm and intact distal pulses.  Exam reveals no gallop and no friction rub.   Murmur heard. Pulses:      Carotid pulses are 1+ on the right side, and 1+ on the left side.      Radial pulses are 1+ on the right side, and 1+ on the left side.       Femoral pulses are 1+ on the right side, and 1+ on the left side.      Popliteal pulses are 1+ on the right side, and 1+ on the left side.       Dorsalis pedis pulses are 1+ on the right side, and 1+ on the left side.       Posterior tibial pulses are 1+ on the right side, and 1+ on the left side.  Pulmonary/Chest: Effort normal and breath sounds normal. No stridor. No respiratory distress. She has no wheezes. She has no rales. She exhibits no tenderness.  Abdominal: Soft. Bowel sounds are normal. She exhibits no distension and no mass. There is no tenderness. There is no rebound and no guarding.  Musculoskeletal: Normal range of motion. She exhibits edema (1+ edema in both LE's). She exhibits no tenderness.       Right lower leg: She exhibits tenderness, swelling and edema. She exhibits no bony tenderness, no deformity and no laceration.       Left lower leg:  She exhibits edema. She exhibits no bony tenderness, no swelling, no deformity and no laceration.       Right foot: She exhibits tenderness and bony tenderness. She exhibits normal  range of motion, no swelling, normal capillary refill, no crepitus, no deformity and no laceration.       Feet:  Lymphadenopathy:    She has no cervical adenopathy.  Neurological: She is oriented to person, place, and time.  Skin: Skin is warm and dry. No rash noted. She is not diaphoretic. No erythema. No pallor.  Psychiatric: She has a normal mood and affect. Judgment and thought content normal. Her mood appears not anxious. Her affect is not angry, not blunt, not labile and not inappropriate. Her speech is delayed and tangential. Her speech is not rapid and/or pressured and not slurred. She is slowed and withdrawn. She is not agitated, not aggressive, is not hyperactive, not actively hallucinating and not combative. Thought content is not paranoid and not delusional. Cognition and memory are impaired. She does not express impulsivity or inappropriate judgment. She does not exhibit a depressed mood. She expresses no homicidal and no suicidal ideation. She expresses no suicidal plans and no homicidal plans. She is communicative. She exhibits abnormal recent memory and abnormal remote memory. She is inattentive.      Lab Results  Component Value Date   WBC 2.7* 10/01/2011   HGB 11.6* 10/01/2011   HCT 33.5* 10/01/2011   PLT 153.0 10/01/2011   GLUCOSE 97 10/01/2011   ALT 27 10/01/2011   AST 37 10/01/2011   NA 139 10/01/2011   K 4.6 10/01/2011   CL 107 10/01/2011   CREATININE 2.0* 10/01/2011   BUN 51* 10/01/2011   CO2 25 10/01/2011   TSH 0.87 06/27/2011   INR 2.7 01/14/2012   HGBA1C 5.5 10/01/2011      Assessment & Plan:

## 2012-02-13 NOTE — Patient Instructions (Signed)
Aplastic Anemia Aplastic anemia occurs when the soft material that makes up the hollow insides of your bones (bone marrow) stops making enough blood cells. These cells are:  Red cells to carry oxygen.   White cells to fight infection.   Platelets to help your blood clot when you have an injury.  Your bone marrow continually makes new blood cells because they do not last very long. Red blood cells live about 4 months. Platelets last only one week and white blood cells last about a day. Anything that hurts or injures your bone marrow can cause aplastic anemia. Aplastic anemia affects all age groups although it seems to appear a little more frequently in children. For unknown reasons, it also appears in people aged 20 to 25 and those over age 60. It usually develops slowly. In a few cases, symptoms can develop very quickly. There are many possible causes. Even after aggressive treatment, as many as 1 in 4 patients can die within the first year. Many are treated successfully but must be monitored for possible recurrent problems. Aplastic anemia is a rare and serious condition.  CAUSES  Some things that injure marrow can include:  Radiation and chemotherapy treatment for cancer. These treatments used to kill cancer cells also damage other cells.   Exposure to toxic chemicals used in some pesticides and insecticides may damage marrow.   Some medications, such as those used to treat rheumatoid arthritis and some antibiotics, can cause secondary aplastic anemia.   Autoimmune disorders in which your immune system begins attacking your own body cells.   Viral infections can affect bone marrow.   Pregnancy   Idiopathic aplastic anemia makes up about half the cases of aplastic anemia. This means the cause is unknown.  SYMPTOMS  Red blood cells carry oxygen. A decrease in red blood cells will make you short of breath. White blood cells fight infection. Decreases in white blood cells make you more  likely to get an infection. Platelets help your blood clot. Too few platelets can cause bleeding.  Other common signs and symptoms include:  Fatigue.   Lightheadedness or fainting.   Shortness of breath and rapid heart rate with exertion.   Pale skin and lips.   Frequent infections.   Easy bruising and bleeding.   Nosebleeds and bleeding gums.   Prolonged bleeding from cuts.   Sore mouth.  DIAGNOSIS   Blood tests and a bone marrow biopsy usually are used to find out what is wrong.   A number of different problems can make one of your blood cells low, but when they are all low, it is more worrisome.   Additional testing may be done to find the underlying cause for the anemia.  TREATMENT  You will usually be referred to a specialist in blood diseases (hematologist) or to a treatment center which specializes in the treatment of aplastic anemia. Severe aplastic anemia is life-threatening. You will need to be hospitalized. Mild or moderate aplastic anemia is still serious, but you may be treated as an outpatient unless complications develop. Treatments may include:  Observation for mild cases.   Blood transfusions.   Medications. When anemia is from an autoimmune disorder, medications may be used to suppress the immune system. Medications are also available to stimulate marrow to make more blood cells.   Bone marrow transplantation. This is a procedure where healthy marrow from a donor is given to the person with aplastic anemia. This is used for severe aplastic anemia. If a   donor is found, the marrow that you have left is depleted with radiation. This is done so the remaining marrow does not try to fight against the healthy donor marrow. The healthy marrow is given into a vein and it goes to the bone marrow cavities where it will hopefully begin producing new blood cells.  This procedure carries risk. If the body rejects the transplant, it can be life-threatening. Not everyone is a  candidate for transplantation. It requires a lengthy stay in the hospital. After the transplant, drugs to help prevent rejection are necessary. One other risk is the danger of catching a disease from the donor. Today the blood supply is the safest it has ever been. The risk is extremely small but still remains. HOME CARE INSTRUCTIONS   Get plenty of rest and eat a well balanced diet.   Avoid excessive exercise. Long-term anemia can stress the heart.   When platelets are at low levels and the risk of bleeding is greater, avoid all activities that risk injury.   Protect yourself from infections by washing your hands often. Avoid crowds. Avoid being around sick people.  PREVENTION  Especially if you have had this problem before, avoid exposure to insecticides, herbicides, organic solvents, paint removers and other toxic chemicals. SEEK IMMEDIATE MEDICAL CARE IF:  You develop a temperature above 100 F (37.8 C).   You develop flu-like feelings, signs of infection, or more frequent infections.   You have blood in your urine or bowel movements.   You develop easy bruising or bleeding from your gums or nose.   You have prolonged bleeding from cuts.   You have increasing shortness of breath or chest pain with exertion.   You develop a rapid heart rate with exertion.   You have increasing fatigue and tiredness.   You develop lightheadedness or fainting.   You develop pale skin and lips.   You develop a sore mouth.  MAKE SURE YOU:   Understand these instructions.   Will watch your condition.   Will get help right away if you are not doing well or get worse.  Document Released: 09/16/2007 Document Revised: 11/08/2011 Document Reviewed: 09/16/2007 [employer] Patient Information 2012 [employer]. 

## 2012-02-13 NOTE — Assessment & Plan Note (Signed)
Stop norvasc due to the edema, I will check her lytes and renal function today

## 2012-02-14 LAB — FERRITIN: Ferritin: 288 ng/mL (ref 10.0–291.0)

## 2012-02-14 LAB — ERYTHROPOIETIN: Erythropoietin: 17.3 m[IU]/mL (ref 2.6–34.0)

## 2012-02-14 LAB — D-DIMER, QUANTITATIVE: D-Dimer, Quant: 0.31 ug/mL-FEU (ref 0.00–0.48)

## 2012-02-14 LAB — FOLATE: Folate: 12.9 ng/mL (ref >5.9)

## 2012-02-14 LAB — TSH: TSH: 1.4 u[IU]/mL (ref 0.35–5.50)

## 2012-02-14 LAB — VITAMIN B12: Vitamin B-12: 763 pg/mL (ref 211–911)

## 2012-02-15 ENCOUNTER — Telehealth: Payer: Self-pay

## 2012-02-15 NOTE — Telephone Encounter (Signed)
Pt advised and transferred to sch follow up appt

## 2012-02-15 NOTE — Telephone Encounter (Signed)
Anemia, mild kidney dysfunction, possible bladder infection, f/up soon please

## 2012-02-15 NOTE — Telephone Encounter (Signed)
Pt called requesting results of recent labs.  

## 2012-02-20 ENCOUNTER — Encounter: Payer: Self-pay | Admitting: Internal Medicine

## 2012-02-20 ENCOUNTER — Other Ambulatory Visit (INDEPENDENT_AMBULATORY_CARE_PROVIDER_SITE_OTHER): Payer: Medicare Other

## 2012-02-20 ENCOUNTER — Ambulatory Visit (INDEPENDENT_AMBULATORY_CARE_PROVIDER_SITE_OTHER): Payer: Medicare Other | Admitting: Internal Medicine

## 2012-02-20 VITALS — BP 100/54 | HR 54 | Temp 97.1°F | Resp 16

## 2012-02-20 DIAGNOSIS — D61818 Other pancytopenia: Secondary | ICD-10-CM

## 2012-02-20 DIAGNOSIS — N289 Disorder of kidney and ureter, unspecified: Secondary | ICD-10-CM

## 2012-02-20 DIAGNOSIS — I10 Essential (primary) hypertension: Secondary | ICD-10-CM

## 2012-02-20 DIAGNOSIS — F0390 Unspecified dementia without behavioral disturbance: Secondary | ICD-10-CM

## 2012-02-20 DIAGNOSIS — N390 Urinary tract infection, site not specified: Secondary | ICD-10-CM

## 2012-02-20 LAB — URINALYSIS, ROUTINE W REFLEX MICROSCOPIC
Bilirubin Urine: NEGATIVE
Ketones, ur: NEGATIVE
Nitrite: NEGATIVE
Specific Gravity, Urine: 1.02 (ref 1.000–1.030)
Urobilinogen, UA: 1 (ref 0.0–1.0)

## 2012-02-20 NOTE — Patient Instructions (Addendum)
Urinary Tract Infection Infections of the urinary tract can start in several places. A bladder infection (cystitis), a kidney infection (pyelonephritis), and a prostate infection (prostatitis) are different types of urinary tract infections (UTIs). They usually get better if treated with medicines (antibiotics) that kill germs. Take all the medicine until it is gone. You or your child may feel better in a few days, but TAKE ALL MEDICINE or the infection may not respond and may become more difficult to treat. HOME CARE INSTRUCTIONS   Drink enough water and fluids to keep the urine clear or pale yellow. Cranberry juice is especially recommended, in addition to large amounts of water.   Avoid caffeine, tea, and carbonated beverages. They tend to irritate the bladder.   Alcohol may irritate the prostate.   Only take over-the-counter or prescription medicines for pain, discomfort, or fever as directed by your caregiver.  To prevent further infections:  Empty the bladder often. Avoid holding urine for long periods of time.   After a bowel movement, women should cleanse from front to back. Use each tissue only once.   Empty the bladder before and after sexual intercourse.  FINDING OUT THE RESULTS OF YOUR TEST Not all test results are available during your visit. If your or your child's test results are not back during the visit, make an appointment with your caregiver to find out the results. Do not assume everything is normal if you have not heard from your caregiver or the medical facility. It is important for you to follow up on all test results. SEEK MEDICAL CARE IF:   There is back pain.   Your baby is older than 3 months with a rectal temperature of 100.5 F (38.1 C) or higher for more than 1 day.   Your or your child's problems (symptoms) are no better in 3 days. Return sooner if you or your child is getting worse.  SEEK IMMEDIATE MEDICAL CARE IF:   There is severe back pain or lower  abdominal pain.   You or your child develops chills.   You have a fever.   Your baby is older than 3 months with a rectal temperature of 102 F (38.9 C) or higher.   Your baby is 75 months old or younger with a rectal temperature of 100.4 F (38 C) or higher.   There is nausea or vomiting.   There is continued burning or discomfort with urination.  MAKE SURE YOU:   Understand these instructions.   Will watch your condition.   Will get help right away if you are not doing well or get worse.  Document Released: 08/29/2005 Document Revised: 11/08/2011 Document Reviewed: 04/03/2007 [facility] Patient Information 2012 [place], [place].Anemia, Nonspecific Your exam and blood tests show you are anemic. This means your blood (hemoglobin) level is low. Normal hemoglobin values are 12 to 15 g/dL for females and 14 to 17 g/dL for males. Make a note of your hemoglobin level today. The hematocrit percent is also used to measure anemia. A normal hematocrit is 38% to 46% in females and 42% to 49% in males. Make a note of your hematocrit level today. CAUSES  Anemia can be due to many different causes.  Excessive bleeding from periods (in women).   Intestinal bleeding.   Poor nutrition.   Kidney, thyroid, liver, and bone marrow diseases.  SYMPTOMS  Anemia can come on suddenly (acute). It can also come on slowly. Symptoms can include:  Minor weakness.   Dizziness.   Palpitations.  Shortness of breath.  Symptoms may be absent until half your hemoglobin is missing if it comes on slowly. Anemia due to acute blood loss from an injury or internal bleeding may require blood transfusion if the loss is severe. Hospital care is needed if you are anemic and there is significant continual blood loss. TREATMENT   Stool tests for blood (Hemoccult) and additional lab tests are often needed. This determines the best treatment.   Further checking on your condition and your response to treatment is very  important. It often takes many weeks to correct anemia.  Depending on the cause, treatment can include:  Supplements of iron.   Vitamins B12 and folic acid.   Hormone medicines.If your anemia is due to bleeding, finding the cause of the blood loss is very important. This will help avoid further problems.  SEEK IMMEDIATE MEDICAL CARE IF:   You develop fainting, extreme weakness, shortness of breath, or chest pain.   You develop heavy vaginal bleeding.   You develop bloody or black, tarry stools or vomit up blood.   You develop a high fever, rash, repeated vomiting, or dehydration.  Document Released: 12/27/2004 Document Revised: 11/08/2011 Document Reviewed: 10/04/2009 [facility] Patient Information 2012 [place], [place].

## 2012-02-22 LAB — CULTURE, URINE COMPREHENSIVE: Organism ID, Bacteria: NO GROWTH

## 2012-02-25 ENCOUNTER — Telehealth: Payer: Self-pay | Admitting: Oncology

## 2012-02-25 NOTE — Telephone Encounter (Signed)
S/w trhe pt's caregiver and she is aware of the new pr appt in march per caregiver she will call back to confirm

## 2012-02-26 ENCOUNTER — Telehealth: Payer: Self-pay | Admitting: Oncology

## 2012-02-26 ENCOUNTER — Other Ambulatory Visit: Payer: Self-pay | Admitting: Oncology

## 2012-02-26 ENCOUNTER — Telehealth: Payer: Self-pay | Admitting: *Deleted

## 2012-02-26 ENCOUNTER — Encounter: Payer: Self-pay | Admitting: Internal Medicine

## 2012-02-26 DIAGNOSIS — D638 Anemia in other chronic diseases classified elsewhere: Secondary | ICD-10-CM

## 2012-02-26 NOTE — Telephone Encounter (Signed)
Referred by Dr. Sanda Linger Dx- Pancytopenia/Anemia

## 2012-02-26 NOTE — Assessment & Plan Note (Signed)
Her renal function is stable 

## 2012-02-26 NOTE — Telephone Encounter (Signed)
Spoke with patient, reminded her of new patient appt tomorrow @ 1:30 and to bring a list or her medications. Gave directions to the clinic.

## 2012-02-26 NOTE — Progress Notes (Signed)
Subjective:    Patient ID: Annette Mckay, female    DOB: 1927-09-27, 78 y.o.   MRN: [id number]  Dysuria  This is a recurrent problem. The current episode started in the past 7 days. The problem occurs every urination. The problem has been unchanged. The quality of the pain is described as burning. The pain is mild. There has been no fever. The fever has been present for less than 1 day. She is not sexually active. There is no history of pyelonephritis. Associated symptoms include frequency and urgency. Pertinent negatives include no chills, discharge, flank pain, hematuria, hesitancy, nausea, possible pregnancy, sweats or vomiting. She has tried nothing for the symptoms. The treatment provided no relief. Her past medical history is significant for recurrent UTIs.      Review of Systems  Constitutional: Negative for fever, chills, diaphoresis, activity change, appetite change, fatigue and unexpected weight change.  HENT: Negative.   Eyes: Negative.   Respiratory: Negative for cough, chest tightness, shortness of breath, wheezing and stridor.   Cardiovascular: Negative for chest pain, palpitations and leg swelling.  Gastrointestinal: Negative for nausea, vomiting, abdominal pain, diarrhea, constipation, blood in stool and abdominal distention.  Genitourinary: Positive for dysuria, urgency and frequency. Negative for hesitancy, hematuria, flank pain, decreased urine volume, enuresis, difficulty urinating and dyspareunia.  Musculoskeletal: Negative for myalgias, back pain, joint swelling, arthralgias and gait problem.  Skin: Negative for pallor, rash and wound.  Neurological: Negative.   Hematological: Negative for adenopathy. Does not bruise/bleed easily.  Psychiatric/Behavioral: Negative.        Objective:   Physical Exam  Vitals reviewed. Constitutional: She is oriented to person, place, and time. She appears well-developed and well-nourished. No distress.  HENT:  Head: Normocephalic  and atraumatic.  Mouth/Throat: No oropharyngeal exudate.  Eyes: Conjunctivae are normal. Right eye exhibits no discharge. Left eye exhibits no discharge. No scleral icterus.  Neck: Normal range of motion. Neck supple. No JVD present. No tracheal deviation present. No thyromegaly present.  Cardiovascular: Normal rate, regular rhythm, normal heart sounds and intact distal pulses.  Exam reveals no gallop and no friction rub.   No murmur heard. Pulmonary/Chest: Effort normal and breath sounds normal. No stridor. No respiratory distress. She has no wheezes. She has no rales.  Abdominal: Soft. Bowel sounds are normal. She exhibits no distension and no mass. There is no tenderness. There is no rebound and no guarding.  Musculoskeletal: Normal range of motion. She exhibits no edema and no tenderness.  Lymphadenopathy:    She has no cervical adenopathy.  Neurological: She is oriented to person, place, and time.  Skin: Skin is warm and dry. No rash noted. She is not diaphoretic. No erythema. No pallor.  Psychiatric: She has a normal mood and affect. Judgment and thought content normal. Her mood appears not anxious. Her affect is not angry, not blunt, not labile and not inappropriate. Her speech is delayed and tangential. Her speech is not rapid and/or pressured and not slurred. She is slowed. She is not agitated, not aggressive, is not hyperactive, not withdrawn, not actively hallucinating and not combative. Thought content is not paranoid and not delusional. Cognition and memory are impaired. She does not express impulsivity or inappropriate judgment. She does not exhibit a depressed mood. She expresses no homicidal and no suicidal ideation. She expresses no suicidal plans and no homicidal plans. She is communicative. She exhibits abnormal recent memory. She is inattentive.      Lab Results  Component Value Date  WBC 3.1* 02/13/2012   HGB 11.3* 02/13/2012   HCT 33.9* 02/13/2012   PLT 161.0 02/13/2012    GLUCOSE 91 02/13/2012   ALT 32 02/13/2012   AST 43* 02/13/2012   NA 138 02/13/2012   K 4.4 02/13/2012   CL 102 02/13/2012   CREATININE 1.9* 02/13/2012   BUN 49* 02/13/2012   CO2 28 02/13/2012   TSH 1.40 02/13/2012   INR 2.5* 02/13/2012   HGBA1C 5.3 02/13/2012     Assessment & Plan:

## 2012-02-26 NOTE — Assessment & Plan Note (Signed)
Her BP is well controlled 

## 2012-02-26 NOTE — Telephone Encounter (Signed)
S/w the pt's caregiver corrina and .she confirmed that they will keep the appt with dr Clelia Croft for tomorrow

## 2012-02-26 NOTE — Assessment & Plan Note (Signed)
I will check her ua and ur clx today

## 2012-02-26 NOTE — Assessment & Plan Note (Signed)
unchanged

## 2012-02-26 NOTE — Assessment & Plan Note (Signed)
She needs an oncology f/up

## 2012-02-27 ENCOUNTER — Telehealth: Payer: Self-pay | Admitting: Oncology

## 2012-02-27 ENCOUNTER — Ambulatory Visit (HOSPITAL_BASED_OUTPATIENT_CLINIC_OR_DEPARTMENT_OTHER): Payer: Medicare Other | Admitting: Oncology

## 2012-02-27 ENCOUNTER — Ambulatory Visit: Payer: Medicare Other

## 2012-02-27 ENCOUNTER — Other Ambulatory Visit: Payer: Medicare Other | Admitting: Lab

## 2012-02-27 VITALS — BP 187/87 | HR 74 | Temp 98.2°F | Ht 61.0 in | Wt 189.5 lb

## 2012-02-27 DIAGNOSIS — D72819 Decreased white blood cell count, unspecified: Secondary | ICD-10-CM

## 2012-02-27 DIAGNOSIS — D638 Anemia in other chronic diseases classified elsewhere: Secondary | ICD-10-CM

## 2012-02-27 LAB — CBC WITH DIFFERENTIAL/PLATELET
Eosinophils Absolute: 0.1 10*3/uL (ref 0.0–0.5)
HCT: 35.3 % (ref 34.8–46.6)
LYMPH%: 26.4 % (ref 14.0–49.7)
MONO#: 0.4 10*3/uL (ref 0.1–0.9)
NEUT#: 2.1 10*3/uL (ref 1.5–6.5)
NEUT%: 60.1 % (ref 38.4–76.8)
Platelets: 153 10*3/uL (ref 145–400)
WBC: 3.5 10*3/uL — ABNORMAL LOW (ref 3.9–10.3)

## 2012-02-27 LAB — CHCC SMEAR

## 2012-02-27 NOTE — Telephone Encounter (Signed)
Gv pt appt for july2013 °

## 2012-02-27 NOTE — Progress Notes (Signed)
Note dictated

## 2012-02-27 NOTE — Progress Notes (Signed)
CC:   Annette Linger, MD  REASON FOR CONSULTATION:  Anemia and leukopenia.  HISTORY OF PRESENT ILLNESS:  An 78 year old African American woman, a native of [place], lived in multiple locations.  She has worked in multiple locations in multiple occupations in the past,currently retired.  She has a history significant for hypertension, chronic renal insufficiency, as well as coronary artery disease and vascular insufficiency and had been following up with Dr. Yetta Barre for evaluation for her medical issues.  She also had been diagnosed with a deep vein thrombosis, although the exact details of that are unclear to me at this time.  She was noted to be mildly anemic in last a few months.  Her blood counts on March 13th showed a hemoglobin of 11.3, a white cell count of 3.1.  Her platelet count was normal, 161.  She had a an MCV of 102.6.  Dating back a year to 2011, her white cell count was 3.5, platelet count was normal at 159, hemoglobin was 12.2, but in 2012 her hemoglobin was 11.8, white count 3.3, platelet count of 147.  For that reason, the patient referred to [place] for evaluation.  Her workup at her primary care physician showed a B12 level of 763, ferritin was 288, folate of 12.9, iron level of 82, saturation level 22.9.  Erythropoietin level was 17.3, which was inappropriately normal.  Clinically Annette Mckay is an elderly, frail woman and complains predominantly of her lower extremity pain.  She has right foot and right knee pain, especially with ambulation.  REVIEW OF SYSTEMS:  Does not report any bleeding headaches, blurry vision, double vision.  Did not report motor or sensory neuropathy.  Did not report any alteration of mental status.  Did not report any psychiatric issues, depression.  Did not report any fever, chills, sweats.  Does not report any cough, hemoptysis, hematemesis.  No nausea or vomiting.  Did not report any abdominal pain.  No constipation, diarrhea.  Rest of her  review of systems is unremarkable.  PAST MEDICAL HISTORY:  Significant for hypertension.  She is borderline diabetic, with a history of DVT.  She has history of venous insufficiency, questionable history of coronary artery disease and congestive heart failure.  MEDICATION:  She is on calcium supplement, Celexa, clonidine, iron sulfate which she has not taken regularly as she is reporting more constipation.  She is on Allegra.  She is on Hyzaar, omeprazole, potassium supplements, Pravachol and VESIcare as well as Coumadin.  ALLERGIES:  To amlodipine and tramadol.  SOCIAL HISTORY:  She is widowed.  She has 1 son.  She denied any alcohol or tobacco abuse.  FAMILY HISTORY:  Really unremarkable for any blood disorders or any malignancies.  PHYSICAL EXAMINATION:  Alert, awake female.  Appeared in no active distress.  Blood pressure today is __________, pulse 74, respiration 20, temperature is 98.2.  Head:  Normocephalic, atraumatic.  Pupils equal and round, reactive to light.  Oral mucosa moist and pink.  Neck: Supple,  no adenopathy.  Heart:  Regular rate and rhythm.  S1 and S2. Lungs:  Clear.  Abdomen:  Soft.  Extremities:  She has 1+ to 2+ edema.  LABORATORY DATA:  Hemoglobin of 12.1, which is within normal range; white cell count of 3.5; platelet count of 153.  Differential was normal.  Neutrophils, lymphocytes, monocytes. eosinophils and basophils are present with normal proportions.  Her MCV is 102, which is elevated. Peripheral smear was personally reviewed today.  Red cells are actually normal.  There is no evidence of any schistocytosis or red cell fragmentation.  There is really no evidence of any dysplasia.  There is no evidence of any platelet clumping.  ASSESSMENT AND PLAN:  This is an 78 year old woman with the following issues: 1. Mild leukopenia.  Her white cell count was 3.5 with normal differential as being rather perfectly proportional at     this point.  The  differential diagnosis was discussed today in     detail with Annette Mckay.  I feel that most likely etiology is ethnic     variation.  Possible lymphoproliferative disorder versus     myelodysplastic syndrome are all definite possibilities.  Certain     medications certainly can cause a leukopenia, but again her     leukopenia had been fluctuating, rather mild, and really been     chronic in nature.  I do not think there is really urgent need for     a bone marrow biopsy or imaging studies at this time, again given     the chronicity as well as the mild nature of her leukopenia. 2. Anemia.  The differential diagnosis, we have anemia of chronic     disease as well as anemia of kidney disease and renal failure.     However, again, her hemoglobin is perfectly normal today.  She does     have some macrocytosis which could suggest she had an element of     myelodysplasia, very early myelodysplasia at best.  Overall, I feel     that her anemia is rather mild.  As a matter of fact, it is     nonexistent today.  Hemoglobin is within normal range.  If she does     develop anemia, it has been rather mild and asymptomatic.  Her iron     studies are within normal range, so I have asked her to discontinue     iron supplementation given the fact that she has not taken them and     has some constipation issues. In terms of management at this point, at this point I think conservative management and a followup on her labs in the next 4-6 months to make sure that she is not having any further deterioration in her cell line. If she does, then a bone marrow biopsy would be warranted at that time.    ______________________________ Benjiman Core, M.D. FNS/MEDQ  D:  02/27/2012  T:  02/27/2012  Job:  [id number]

## 2012-02-28 LAB — COMPREHENSIVE METABOLIC PANEL
BUN: 40 mg/dL — ABNORMAL HIGH (ref 6–23)
CO2: 28 mEq/L (ref 19–32)
Creatinine, Ser: 1.42 mg/dL — ABNORMAL HIGH (ref 0.50–1.10)
Glucose, Bld: 104 mg/dL — ABNORMAL HIGH (ref 70–99)
Total Bilirubin: 0.8 mg/dL (ref 0.3–1.2)

## 2012-02-28 LAB — ERYTHROPOIETIN: Erythropoietin: 17.4 m[IU]/mL (ref 2.6–34.0)

## 2012-03-05 ENCOUNTER — Ambulatory Visit: Payer: Medicare Other | Admitting: Internal Medicine

## 2012-03-12 ENCOUNTER — Other Ambulatory Visit (INDEPENDENT_AMBULATORY_CARE_PROVIDER_SITE_OTHER): Payer: Medicare Other

## 2012-03-12 ENCOUNTER — Ambulatory Visit (INDEPENDENT_AMBULATORY_CARE_PROVIDER_SITE_OTHER): Payer: Medicare Other | Admitting: Internal Medicine

## 2012-03-12 ENCOUNTER — Encounter: Payer: Self-pay | Admitting: Internal Medicine

## 2012-03-12 VITALS — BP 126/80 | HR 78 | Temp 98.5°F | Resp 16 | Wt 187.0 lb

## 2012-03-12 DIAGNOSIS — I82409 Acute embolism and thrombosis of unspecified deep veins of unspecified lower extremity: Secondary | ICD-10-CM

## 2012-03-12 DIAGNOSIS — D638 Anemia in other chronic diseases classified elsewhere: Secondary | ICD-10-CM

## 2012-03-12 DIAGNOSIS — I10 Essential (primary) hypertension: Secondary | ICD-10-CM

## 2012-03-12 DIAGNOSIS — M179 Osteoarthritis of knee, unspecified: Secondary | ICD-10-CM

## 2012-03-12 DIAGNOSIS — R7309 Other abnormal glucose: Secondary | ICD-10-CM

## 2012-03-12 DIAGNOSIS — N289 Disorder of kidney and ureter, unspecified: Secondary | ICD-10-CM

## 2012-03-12 DIAGNOSIS — E7800 Pure hypercholesterolemia, unspecified: Secondary | ICD-10-CM

## 2012-03-12 DIAGNOSIS — Z7901 Long term (current) use of anticoagulants: Secondary | ICD-10-CM

## 2012-03-12 DIAGNOSIS — M1710 Unilateral primary osteoarthritis, unspecified knee: Secondary | ICD-10-CM

## 2012-03-12 LAB — CBC WITH DIFFERENTIAL/PLATELET
Basophils Absolute: 0 10*3/uL (ref 0.0–0.1)
Eosinophils Absolute: 0 10*3/uL (ref 0.0–0.7)
Eosinophils Relative: 1.4 % (ref 0.0–5.0)
Lymphs Abs: 0.8 10*3/uL (ref 0.7–4.0)
MCV: 103.1 fl — ABNORMAL HIGH (ref 78.0–100.0)
Monocytes Absolute: 0.3 10*3/uL (ref 0.1–1.0)
Neutrophils Relative %: 62.9 % (ref 43.0–77.0)
Platelets: 150 10*3/uL (ref 150.0–400.0)
RDW: 12.9 % (ref 11.5–14.6)
WBC: 3 10*3/uL — ABNORMAL LOW (ref 4.5–10.5)

## 2012-03-12 LAB — COMPREHENSIVE METABOLIC PANEL
ALT: 31 U/L (ref 0–35)
Albumin: 4.1 g/dL (ref 3.5–5.2)
Alkaline Phosphatase: 47 U/L (ref 39–117)
CO2: 29 mEq/L (ref 19–32)
Glucose, Bld: 94 mg/dL (ref 70–99)
Potassium: 3.9 mEq/L (ref 3.5–5.1)
Sodium: 143 mEq/L (ref 135–145)
Total Protein: 7.9 g/dL (ref 6.0–8.3)

## 2012-03-12 LAB — LIPID PANEL
Cholesterol: 132 mg/dL (ref 0–200)
LDL Cholesterol: 65 mg/dL (ref 0–99)
Triglycerides: 68 mg/dL (ref 0.0–149.0)

## 2012-03-12 LAB — URINALYSIS, ROUTINE W REFLEX MICROSCOPIC
Bilirubin Urine: NEGATIVE
Ketones, ur: NEGATIVE
Nitrite: NEGATIVE
Total Protein, Urine: NEGATIVE
pH: 6 (ref 5.0–8.0)

## 2012-03-12 MED ORDER — OXYCODONE-ACETAMINOPHEN 10-650 MG PO TABS: 1.0000 | ORAL_TABLET | Freq: Four times a day (QID) | ORAL | Status: DC | PRN

## 2012-03-12 MED ORDER — FLAVOCOXID-CIT ZN BISGLCINATE 500-50 MG PO CAPS: 1.0000 | ORAL_CAPSULE | Freq: Two times a day (BID) | ORAL | Status: DC

## 2012-03-12 NOTE — Assessment & Plan Note (Signed)
Labs today: FLP CPK CMP TSH

## 2012-03-12 NOTE — Patient Instructions (Signed)
Hypercholesterolemia High Blood Cholesterol Cholesterol is a white, waxy, fat-like protein needed by your body in small amounts. The liver makes all the cholesterol you need. It is carried from the liver by the blood through the blood vessels. Deposits (plaque) may build up on blood vessel walls. This makes the arteries narrower and stiffer. Plaque increases the risk for heart attack and stroke. You cannot feel your cholesterol level even if it is very high. The only way to know is by a blood test to check your lipid (fats) levels. Once you know your cholesterol levels, you should keep a record of the test results. Work with your caregiver to to keep your levels in the desired range. WHAT THE RESULTS MEAN:  Total cholesterol is a rough measure of all the cholesterol in your blood.   LDL is the so-called bad cholesterol. This is the type that deposits cholesterol in the walls of the arteries. You want this level to be low.   HDL is the good cholesterol because it cleans the arteries and carries the LDL away. You want this level to be high.   Triglycerides are fat that the body can either burn for energy or store. High levels are closely linked to heart disease.  DESIRED LEVELS:  Total cholesterol below 200.   LDL below 100 for people at risk, below 70 for very high risk.   HDL above 50 is good, above 60 is best.   Triglycerides below 150.  HOW TO LOWER YOUR CHOLESTEROL:  Diet.   Choose fish or white meat chicken and [place], roasted or baked. Limit fatty cuts of red meat, fried foods, and processed meats, such as sausage and lunch meat.   Eat lots of fresh fruits and vegetables. Choose whole grains, beans, pasta, potatoes and cereals.   Use only small amounts of olive, corn or canola oils. Avoid butter, mayonnaise, shortening or palm kernel oils. Avoid foods with trans-fats.   Use skim/nonfat milk and low-fat/nonfat yogurt and cheeses. Avoid whole milk, cream, ice cream, egg yolks and  cheeses. Healthy desserts include angel food cake, gingersnaps, animal crackers, hard candy, popsicles, and low-fat/nonfat frozen yogurt. Avoid pastries, cakes, pies and cookies.   Exercise.   A regular program helps decrease LDL and raises HDL.   Helps with weight control.   Do things that increase your activity level like gardening, walking, or taking the stairs.   Medication.   May be prescribed by your caregiver to help lowering cholesterol and the risk for heart disease.   You may need medicine even if your levels are normal if you have several risk factors.  HOME CARE INSTRUCTIONS   Follow your diet and exercise programs as suggested by your caregiver.   Take medications as directed.   Have blood work done when your caregiver feels it is necessary.  MAKE SURE YOU:   Understand these instructions.   Will watch your condition.   Will get help right away if you are not doing well or get worse.  Document Released: 11/19/2005 Document Revised: 11/08/2011 Document Reviewed: 05/07/2007 [facility] Patient Information 2012 [place], [place].Degenerative Arthritis You have osteoarthritis. This is the wear and tear arthritis that comes with aging. It is also called degenerative arthritis. This is common in people past middle age. It is caused by stress on the joints. The large weight bearing joints of the lower extremities are most often affected. The knees, hips, back, neck, and hands can become painful, swollen, and stiff. This is the most common  type of arthritis. It comes on with age, carrying too much weight, or from an injury. Treatment includes resting the sore joint until the pain and swelling improve. Crutches or a walker may be needed for severe flares. Only take over-the-counter or prescription medicines for pain, discomfort, or fever as directed by your caregiver. Local heat therapy may improve motion. Cortisone shots into the joint are sometimes used to reduce pain and swelling  during flares. Osteoarthritis is usually not crippling and progresses slowly. There are things you can do to decrease pain:  Avoid high impact activities.   Exercise regularly.   Low impact exercises such as walking, biking and swimming help to keep the muscles strong and keep normal joint function.   Stretching helps to keep your range of motion.   Lose weight if you are overweight. This reduces joint stress.  In severe cases when you have pain at rest or increasing disability, joint surgery may be helpful. See your caregiver for follow-up treatment as recommended.  SEEK IMMEDIATE MEDICAL CARE IF:   You have severe joint pain.   Marked swelling and redness in your joint develops.   You develop a high fever.  Document Released: 11/19/2005 Document Revised: 11/08/2011 Document Reviewed: 04/21/2007 [facility] Patient Information 2012 [place], [place].

## 2012-03-12 NOTE — Assessment & Plan Note (Signed)
I will recheck her a1c today 

## 2012-03-12 NOTE — Assessment & Plan Note (Signed)
Continue percocet and try limbrel as well

## 2012-03-12 NOTE — Assessment & Plan Note (Signed)
Cbc today

## 2012-03-12 NOTE — Assessment & Plan Note (Signed)
Her bp is well controlled 

## 2012-03-12 NOTE — Progress Notes (Signed)
Subjective:    Patient ID: Annette Mckay, female    DOB: 19-Jul-1927, 78 y.o.   MRN: [id number]  Hyperlipidemia This is a chronic problem. The current episode started more than 1 year ago. The problem is controlled. Recent lipid tests were reviewed and are variable. Exacerbating diseases include obesity. She has no history of diabetes, liver disease or nephrotic syndrome. Pertinent negatives include no chest pain or shortness of breath. Current antihyperlipidemic treatment includes statins. The current treatment provides moderate improvement of lipids. Compliance problems include adherence to exercise and adherence to diet.   Arthritis Presents for follow-up visit. She complains of pain, stiffness and joint swelling. She reports no joint warmth. The symptoms have been stable. Affected locations include the right knee, left knee, left hip and right hip. Her pain is at a severity of 3/10. Associated symptoms include fatigue, pain at night and pain while resting. Pertinent negatives include no diarrhea, dry eyes, dry mouth, dysuria, fever, rash, Raynaud's syndrome or uveitis. Compliance with total regimen is 51-75%.      Review of Systems  Constitutional: Positive for fatigue. Negative for fever, chills, diaphoresis, activity change, appetite change and unexpected weight change.  HENT: Negative.   Eyes: Negative.   Respiratory: Negative for cough, chest tightness, shortness of breath, wheezing and stridor.   Cardiovascular: Negative for chest pain, palpitations and leg swelling.  Gastrointestinal: Negative for nausea, vomiting, abdominal pain, diarrhea, constipation, blood in stool, abdominal distention and anal bleeding.  Genitourinary: Negative.  Negative for dysuria.  Musculoskeletal: Positive for joint swelling, arthralgias, arthritis and stiffness. Negative for back pain and gait problem.  Skin: Negative for color change, pallor, rash and wound.  Neurological: Negative.   Hematological:  Negative for adenopathy. Does not bruise/bleed easily.  Psychiatric/Behavioral: Negative.        Objective:   Physical Exam  Vitals reviewed. Constitutional: She is oriented to person, place, and time. She appears well-developed and well-nourished. No distress.  HENT:  Head: Normocephalic and atraumatic.  Mouth/Throat: Oropharynx is clear and moist. No oropharyngeal exudate.  Eyes: Conjunctivae are normal. Right eye exhibits no discharge. Left eye exhibits no discharge. No scleral icterus.  Neck: Normal range of motion. Neck supple. No JVD present. No tracheal deviation present. No thyromegaly present.  Cardiovascular: Normal rate, regular rhythm, normal heart sounds and intact distal pulses.  Exam reveals no gallop and no friction rub.   No murmur heard. Pulmonary/Chest: Effort normal and breath sounds normal. No stridor. No respiratory distress. She has no wheezes. She exhibits no tenderness.  Abdominal: Soft. Bowel sounds are normal. She exhibits no distension and no mass. There is no tenderness. There is no rebound and no guarding.  Musculoskeletal: Normal range of motion. She exhibits edema (trace edema in BLE's). She exhibits no tenderness.       Right knee: She exhibits deformity (DJD signs). She exhibits normal range of motion, no swelling, no effusion, no ecchymosis, no laceration, no erythema, normal alignment, no LCL laxity and normal patellar mobility.       Left knee: She exhibits deformity (DJD signs). She exhibits normal range of motion, no swelling, no effusion, no ecchymosis, no laceration, no erythema, normal alignment, no LCL laxity and normal patellar mobility.  Lymphadenopathy:    She has no cervical adenopathy.  Neurological: She is oriented to person, place, and time.  Skin: Skin is warm and dry. No rash noted. She is not diaphoretic. No erythema. No pallor.  Psychiatric: She has a normal mood and affect. Her  behavior is normal. Judgment and thought content normal.       Lab Results  Component Value Date   WBC 3.5* 02/27/2012   HGB 12.1 02/27/2012   HCT 35.3 02/27/2012   PLT 153 02/27/2012   GLUCOSE 104* 02/27/2012   ALT 24 02/27/2012   AST 40* 02/27/2012   NA 139 02/27/2012   K 4.1 02/27/2012   CL 103 02/27/2012   CREATININE 1.42* 02/27/2012   BUN 40* 02/27/2012   CO2 28 02/27/2012   TSH 1.40 02/13/2012   INR 2.5* 02/13/2012   HGBA1C 5.3 02/13/2012      Assessment & Plan:

## 2012-03-12 NOTE — Assessment & Plan Note (Signed)
I will recheck her renal function today. °

## 2012-03-14 ENCOUNTER — Encounter: Payer: Self-pay | Admitting: Internal Medicine

## 2012-03-14 ENCOUNTER — Ambulatory Visit (INDEPENDENT_AMBULATORY_CARE_PROVIDER_SITE_OTHER): Payer: Medicare Other | Admitting: Pharmacist

## 2012-03-14 DIAGNOSIS — I82409 Acute embolism and thrombosis of unspecified deep veins of unspecified lower extremity: Secondary | ICD-10-CM

## 2012-03-14 DIAGNOSIS — Z7901 Long term (current) use of anticoagulants: Secondary | ICD-10-CM

## 2012-03-14 LAB — HEMOGLOBIN A1C: Hgb A1c MFr Bld: 5.3 % (ref 4.6–6.5)

## 2012-03-14 LAB — POCT INR: INR: 2

## 2012-03-14 MED ORDER — WARFARIN SODIUM 5 MG PO TABS: 5.0000 mg | ORAL_TABLET | ORAL | Status: DC

## 2012-03-25 ENCOUNTER — Encounter: Payer: Self-pay | Admitting: Internal Medicine

## 2012-04-16 ENCOUNTER — Ambulatory Visit (INDEPENDENT_AMBULATORY_CARE_PROVIDER_SITE_OTHER): Payer: Medicare Other | Admitting: *Deleted

## 2012-04-16 DIAGNOSIS — I82409 Acute embolism and thrombosis of unspecified deep veins of unspecified lower extremity: Secondary | ICD-10-CM

## 2012-04-16 DIAGNOSIS — Z7901 Long term (current) use of anticoagulants: Secondary | ICD-10-CM

## 2012-04-17 ENCOUNTER — Other Ambulatory Visit: Payer: Self-pay | Admitting: Cardiology

## 2012-05-07 ENCOUNTER — Ambulatory Visit (INDEPENDENT_AMBULATORY_CARE_PROVIDER_SITE_OTHER): Payer: Medicare Other | Admitting: *Deleted

## 2012-05-07 DIAGNOSIS — Z7901 Long term (current) use of anticoagulants: Secondary | ICD-10-CM

## 2012-05-07 DIAGNOSIS — I82409 Acute embolism and thrombosis of unspecified deep veins of unspecified lower extremity: Secondary | ICD-10-CM

## 2012-05-16 ENCOUNTER — Other Ambulatory Visit: Payer: Self-pay | Admitting: Internal Medicine

## 2012-05-28 ENCOUNTER — Ambulatory Visit (INDEPENDENT_AMBULATORY_CARE_PROVIDER_SITE_OTHER): Payer: Medicare Other | Admitting: Pharmacist

## 2012-05-28 DIAGNOSIS — I82409 Acute embolism and thrombosis of unspecified deep veins of unspecified lower extremity: Secondary | ICD-10-CM

## 2012-05-28 DIAGNOSIS — Z7901 Long term (current) use of anticoagulants: Secondary | ICD-10-CM

## 2012-05-28 LAB — POCT INR: INR: 2.2

## 2012-06-11 ENCOUNTER — Encounter: Payer: Self-pay | Admitting: Internal Medicine

## 2012-06-11 ENCOUNTER — Ambulatory Visit (INDEPENDENT_AMBULATORY_CARE_PROVIDER_SITE_OTHER): Payer: Medicare Other | Admitting: Internal Medicine

## 2012-06-11 VITALS — BP 182/98 | HR 72 | Temp 98.8°F | Resp 18 | Wt 180.6 lb

## 2012-06-11 DIAGNOSIS — N289 Disorder of kidney and ureter, unspecified: Secondary | ICD-10-CM

## 2012-06-11 DIAGNOSIS — M545 Low back pain: Secondary | ICD-10-CM

## 2012-06-11 DIAGNOSIS — M1710 Unilateral primary osteoarthritis, unspecified knee: Secondary | ICD-10-CM

## 2012-06-11 DIAGNOSIS — I10 Essential (primary) hypertension: Secondary | ICD-10-CM

## 2012-06-11 MED ORDER — OXYCODONE-ACETAMINOPHEN 10-650 MG PO TABS: 1.0000 | ORAL_TABLET | Freq: Four times a day (QID) | ORAL | Status: AC | PRN

## 2012-06-11 MED ORDER — NEBIVOLOL HCL 5 MG PO TABS: 5.0000 mg | ORAL_TABLET | Freq: Every day | ORAL | Status: DC

## 2012-06-11 NOTE — Progress Notes (Signed)
Subjective:    Patient ID: Annette Mckay, female    DOB: 02-Dec-1927, 78 y.o.   MRN: [id number]  Hypertension This is a chronic problem. The current episode started more than 1 year ago. The problem has been gradually worsening since onset. The problem is uncontrolled. Associated symptoms include headaches. Pertinent negatives include no anxiety, blurred vision, chest pain, malaise/fatigue, neck pain, orthopnea, palpitations, peripheral edema, PND, shortness of breath or sweats. Past treatments include angiotensin blockers, diuretics and central alpha agonists. Compliance problems include exercise, diet and psychosocial issues.  Hypertensive end-organ damage includes kidney disease. Identifiable causes of hypertension include chronic renal disease.      Review of Systems  Constitutional: Negative for fever, chills, malaise/fatigue, diaphoresis, activity change, appetite change, fatigue and unexpected weight change.  HENT: Negative for neck pain.   Eyes: Negative.  Negative for blurred vision.  Respiratory: Negative for cough, chest tightness, shortness of breath, wheezing and stridor.   Cardiovascular: Negative for chest pain, palpitations, orthopnea, leg swelling and PND.  Gastrointestinal: Negative for nausea, vomiting, abdominal pain, diarrhea, constipation, blood in stool and abdominal distention.  Genitourinary: Negative.   Musculoskeletal: Positive for back pain (chronic, unchanged) and arthralgias (B knee pain). Negative for myalgias, joint swelling and gait problem.  Skin: Negative for color change, pallor, rash and wound.  Neurological: Positive for headaches. Negative for dizziness, tremors, seizures, syncope, facial asymmetry, speech difficulty, weakness, light-headedness and numbness.  Hematological: Negative for adenopathy. Does not bruise/bleed easily.  Psychiatric/Behavioral: Positive for confusion and decreased concentration. Negative for suicidal ideas, hallucinations,  behavioral problems, disturbed wake/sleep cycle, self-injury, dysphoric mood and agitation. The patient is not nervous/anxious and is not hyperactive.        Objective:   Physical Exam  Vitals reviewed. Constitutional: She is oriented to person, place, and time. She appears well-developed and well-nourished. No distress.  HENT:  Head: Normocephalic and atraumatic.  Mouth/Throat: Oropharynx is clear and moist. No oropharyngeal exudate.  Eyes: Conjunctivae are normal. Right eye exhibits no discharge. Left eye exhibits no discharge. No scleral icterus.  Neck: Normal range of motion. Neck supple. No JVD present. No tracheal deviation present. No thyromegaly present.  Cardiovascular: Normal rate, regular rhythm, normal heart sounds and intact distal pulses.  Exam reveals no gallop and no friction rub.   No murmur heard. Pulmonary/Chest: Effort normal and breath sounds normal. No stridor. No respiratory distress. She has no wheezes. She has no rales. She exhibits no tenderness.  Abdominal: Soft. Bowel sounds are normal. She exhibits no distension and no mass. There is no tenderness. There is no rebound and no guarding.  Musculoskeletal: Normal range of motion. She exhibits no edema and no tenderness.  Lymphadenopathy:    She has no cervical adenopathy.  Neurological: She is oriented to person, place, and time.  Skin: Skin is warm and dry. No rash noted. She is not diaphoretic. No erythema. No pallor.  Psychiatric: She has a normal mood and affect. Judgment normal. Her mood appears not anxious. Her affect is not angry, not blunt, not labile and not inappropriate. Her speech is delayed and tangential. Her speech is not slurred. She is slowed. She is not agitated, not aggressive, is not hyperactive, not withdrawn, not actively hallucinating and not combative. Cognition and memory are impaired. She does not exhibit a depressed mood. She exhibits abnormal recent memory and abnormal remote memory. She is  inattentive.      Lab Results  Component Value Date   WBC 3.0* 03/12/2012   HGB  11.6* 03/12/2012   HCT 34.3* 03/12/2012   PLT 150.0 03/12/2012   GLUCOSE 94 03/12/2012   CHOL 132 03/12/2012   TRIG 68.0 03/12/2012   HDL 53.80 03/12/2012   LDLCALC 65 03/12/2012   ALT 31 03/12/2012   AST 44* 03/12/2012   NA 143 03/12/2012   K 3.9 03/12/2012   CL 104 03/12/2012   CREATININE 1.5* 03/12/2012   BUN 35* 03/12/2012   CO2 29 03/12/2012   TSH 0.41 03/12/2012   INR 2.2 05/28/2012   HGBA1C 5.3 03/12/2012      Assessment & Plan:

## 2012-06-11 NOTE — Patient Instructions (Signed)

## 2012-06-12 NOTE — Assessment & Plan Note (Signed)
Her BP is not well controlled, I have asked her to add bystolic to her med regimen, I will recheck her lytes and renal function today

## 2012-06-12 NOTE — Assessment & Plan Note (Signed)
Start bystolic to get better BP control and I will recheck her renal function today

## 2012-06-12 NOTE — Assessment & Plan Note (Signed)
Continue percocet as needed 

## 2012-06-17 ENCOUNTER — Other Ambulatory Visit: Payer: Self-pay | Admitting: Internal Medicine

## 2012-06-27 ENCOUNTER — Other Ambulatory Visit (HOSPITAL_BASED_OUTPATIENT_CLINIC_OR_DEPARTMENT_OTHER): Payer: Medicare Other | Admitting: Lab

## 2012-06-27 ENCOUNTER — Ambulatory Visit: Payer: Medicare Other | Admitting: Internal Medicine

## 2012-06-27 ENCOUNTER — Ambulatory Visit (INDEPENDENT_AMBULATORY_CARE_PROVIDER_SITE_OTHER): Payer: Medicare Other | Admitting: *Deleted

## 2012-06-27 ENCOUNTER — Ambulatory Visit (HOSPITAL_BASED_OUTPATIENT_CLINIC_OR_DEPARTMENT_OTHER): Payer: Medicare Other | Admitting: Oncology

## 2012-06-27 ENCOUNTER — Telehealth: Payer: Self-pay | Admitting: *Deleted

## 2012-06-27 VITALS — BP 203/77 | HR 54 | Temp 98.5°F | Wt 182.5 lb

## 2012-06-27 DIAGNOSIS — Z7901 Long term (current) use of anticoagulants: Secondary | ICD-10-CM

## 2012-06-27 DIAGNOSIS — D638 Anemia in other chronic diseases classified elsewhere: Secondary | ICD-10-CM

## 2012-06-27 DIAGNOSIS — D72819 Decreased white blood cell count, unspecified: Secondary | ICD-10-CM

## 2012-06-27 DIAGNOSIS — D696 Thrombocytopenia, unspecified: Secondary | ICD-10-CM

## 2012-06-27 DIAGNOSIS — D649 Anemia, unspecified: Secondary | ICD-10-CM

## 2012-06-27 DIAGNOSIS — D61818 Other pancytopenia: Secondary | ICD-10-CM

## 2012-06-27 DIAGNOSIS — I82409 Acute embolism and thrombosis of unspecified deep veins of unspecified lower extremity: Secondary | ICD-10-CM

## 2012-06-27 LAB — COMPREHENSIVE METABOLIC PANEL
ALT: 26 U/L (ref 0–35)
AST: 36 U/L (ref 0–37)
Alkaline Phosphatase: 58 U/L (ref 39–117)
Potassium: 4.3 mEq/L (ref 3.5–5.3)
Sodium: 140 mEq/L (ref 135–145)
Total Bilirubin: 0.5 mg/dL (ref 0.3–1.2)
Total Protein: 7.9 g/dL (ref 6.0–8.3)

## 2012-06-27 LAB — CBC WITH DIFFERENTIAL/PLATELET
EOS%: 3.7 % (ref 0.0–7.0)
MCH: 35.2 pg — ABNORMAL HIGH (ref 25.1–34.0)
MCV: 103.5 fL — ABNORMAL HIGH (ref 79.5–101.0)
MONO%: 10.5 % (ref 0.0–14.0)
NEUT#: 1.7 10*3/uL (ref 1.5–6.5)
RBC: 3.36 10*6/uL — ABNORMAL LOW (ref 3.70–5.45)
RDW: 12.9 % (ref 11.2–14.5)
lymph#: 0.9 10*3/uL (ref 0.9–3.3)

## 2012-06-27 LAB — CHCC SMEAR

## 2012-06-27 LAB — POCT INR: INR: 2.7

## 2012-06-27 NOTE — Telephone Encounter (Signed)
Gave patient appointment appointment for [phone]

## 2012-06-27 NOTE — Progress Notes (Signed)
Hematology and Oncology Follow Up Visit  Annette Mckay [id number] 1927-10-06 78 y.o. 06/27/2012 3:57 PM    Principle Diagnosis: 78 year old woman with the following with mild leukopenia. Her white cell count was 3.5 with the diagnosis possible lymphoproliferative disorder versus myelodysplastic syndrome vs reactive.  Current therapy: Observation and surveillance.   Interim History: An 78 year old African American woman presents today for a follow up visit. Clinically she has been doing well since her last visit. She complains predominantly of her lower extremity pain. She has right foot and right knee pain, especially with ambulation. No bleeding problems at this time.  She reports no hospitalizations or illnesses.    Medications: I have reviewed the patient's current medications. Current outpatient prescriptions:Calcium Carb-Cholecalciferol (CALCIUM 500 +D) 500-400 MG-UNIT TABS, Take by mouth 3 (three) times daily.  , Disp: , Rfl: ;  CELEXA 10 MG tablet, TAKE ONE TABLET BY MOUTH EVERY DAY FOR DEPRESSION., Disp: 90 each, Rfl: 3;  cloNIDine (CATAPRES) 0.2 MG tablet, Take 0.2 mg by mouth daily., Disp: , Rfl: ;  COUMADIN 5 MG tablet, TAKE ONE TABLET BY MOUTH AS DIRECTED BY CLINIC, Disp: 35 each, Rfl: 3 losartan-hydrochlorothiazide (HYZAAR) 100-25 MG per tablet, TAKE ONE TABLET BY MOUTH DAILY FOR HIGH BLOOD PRESSURE., Disp: 90 tablet, Rfl: 3;  potassium chloride SA (K-DUR,KLOR-CON) 20 MEQ tablet, Take 20 mEq by mouth daily.  , Disp: , Rfl: ;  PRAVACHOL 40 MG tablet, TAKE ONE TABLET BY MOUTH AT BEDTIME FORCHOLESTEROL., Disp: 90 each, Rfl: 3;  PRILOSEC 20 MG capsule, TAKE ONE CAPSULE BY MOUTH EVERY DAY FOR INDIGESTION., Disp: 90 each, Rfl: 3 ferrous sulfate 325 (65 FE) MG tablet, Take 325 mg by mouth daily with breakfast., Disp: , Rfl: ;  fexofenadine (ALLEGRA) 180 MG tablet, Take 1 tablet (180 mg total) by mouth daily., Disp: 90 tablet, Rfl: 3;  nebivolol (BYSTOLIC) 5 MG tablet, Take 1 tablet (5 mg  total) by mouth daily., Disp: 70 tablet, Rfl: 0;  solifenacin (VESICARE) 5 MG tablet, Take 1 tablet (5 mg total) by mouth daily., Disp: 42 tablet, Rfl: 0  Allergies:  Allergies  Allergen Reactions  . Amlodipine     medium  . Tramadol     hallucinations    Past Medical History, Surgical history, Social history, and Family History were reviewed and updated.  Review of Systems: Constitutional:  Negative for fever, chills, night sweats, anorexia, weight loss, pain. Cardiovascular: no chest pain or dyspnea on exertion Respiratory: negative Neurological: negative Dermatological: negative ENT: negative Skin: Negative. Gastrointestinal: negative Genito-Urinary: negative Hematological and Lymphatic: negative Musculoskeletal: negative Remaining ROS negative. Physical Exam: Blood pressure 203/77, pulse 54, temperature 98.5 F (36.9 C), temperature source Oral, weight 182 lb 8 oz (82.781 kg). ECOG: 2 General appearance: alert Head: Normocephalic, without obvious abnormality, atraumatic Neck: no adenopathy, no carotid bruit, no JVD, supple, symmetrical, trachea midline and thyroid not enlarged, symmetric, no tenderness/mass/nodules Lymph nodes: Cervical, supraclavicular, and axillary nodes normal. Heart:regular rate and rhythm, S1, S2 normal, no murmur, click, rub or gallop Lung:chest clear, no wheezing, rales, normal symmetric air entry Abdomin: soft, non-tender, without masses or organomegaly EXT:no erythema, induration, or nodules   Lab Results: Lab Results  Component Value Date   WBC 3.1* 06/27/2012   HGB 11.8 06/27/2012   HCT 34.8 06/27/2012   MCV 103.5* 06/27/2012   PLT 137* 06/27/2012     Chemistry      Component Value Date/Time   NA 140 06/27/2012 1457   K 4.3 06/27/2012 1457  CL 103 06/27/2012 1457   CO2 30 06/27/2012 1457   BUN 42* 06/27/2012 1457   CREATININE 1.41* 06/27/2012 1457      Component Value Date/Time   CALCIUM 10.8* 06/27/2012 1457   ALKPHOS 58 06/27/2012  1457   AST 36 06/27/2012 1457   ALT 26 06/27/2012 1457   BILITOT 0.5 06/27/2012 1457        Impression and Plan:  This is an 78 year old woman with the following issues:  1. Mild leukopenia. Her white cell count was 3.1 with normal differential as being rather perfectly proportional at this point. The differential diagnosis was discussed today in detail with Annette Mckay. I feel that most likely etiology is ethnic variation. Possible lymphoproliferative disorder versus myelodysplastic.  2. Anemia. The differential diagnosis, we have anemia of chronic disease as well as anemia of kidney disease and renal failure.  However, again, her hemoglobin is perfectly normal today.  3. Thrombocytopenia: Is very mild for now with out bleeding.   At this time, I will continue to monitor her counts and repeat it again in 6 months. For now, I see no need for a bone marrow biopsy as it will not change my management.     Eli Hose, MD 7/26/20133:57 PM

## 2012-07-02 ENCOUNTER — Ambulatory Visit (INDEPENDENT_AMBULATORY_CARE_PROVIDER_SITE_OTHER): Payer: Medicare Other | Admitting: Internal Medicine

## 2012-07-02 ENCOUNTER — Encounter: Payer: Self-pay | Admitting: Internal Medicine

## 2012-07-02 VITALS — BP 136/82 | HR 66 | Temp 98.4°F | Resp 16 | Wt 183.0 lb

## 2012-07-02 DIAGNOSIS — I10 Essential (primary) hypertension: Secondary | ICD-10-CM

## 2012-07-02 DIAGNOSIS — N289 Disorder of kidney and ureter, unspecified: Secondary | ICD-10-CM

## 2012-07-02 DIAGNOSIS — D61818 Other pancytopenia: Secondary | ICD-10-CM

## 2012-07-02 DIAGNOSIS — M1710 Unilateral primary osteoarthritis, unspecified knee: Secondary | ICD-10-CM

## 2012-07-02 NOTE — Progress Notes (Signed)
Subjective:    Patient ID: Annette Mckay, female    DOB: 11/28/1927, 78 y.o.   MRN: [id number]  Hypertension This is a chronic problem. The current episode started more than 1 year ago. The problem has been gradually improving since onset. The problem is controlled. Pertinent negatives include no anxiety, blurred vision, chest pain, headaches, malaise/fatigue, neck pain, orthopnea, palpitations, peripheral edema, PND, shortness of breath or sweats. Past treatments include angiotensin blockers, diuretics, beta blockers and central alpha agonists. The current treatment provides significant improvement. Compliance problems include exercise and diet.  Hypertensive end-organ damage includes kidney disease. Identifiable causes of hypertension include chronic renal disease.      Review of Systems  Constitutional: Negative for fever, chills, malaise/fatigue, diaphoresis, activity change, appetite change, fatigue and unexpected weight change.  HENT: Negative.  Negative for neck pain.   Eyes: Negative.  Negative for blurred vision.  Respiratory: Negative for cough, chest tightness, shortness of breath, wheezing and stridor.   Cardiovascular: Negative for chest pain, palpitations, orthopnea, leg swelling and PND.  Gastrointestinal: Negative for nausea, vomiting, constipation, blood in stool, abdominal distention and anal bleeding.  Genitourinary: Negative.   Musculoskeletal: Positive for arthralgias (Bilateral knee pain). Negative for myalgias, back pain, joint swelling and gait problem.  Skin: Negative for color change, pallor, rash and wound.  Neurological: Negative.  Negative for headaches.  Hematological: Negative for adenopathy. Does not bruise/bleed easily.  Psychiatric/Behavioral: Negative.        Objective:   Physical Exam  Vitals reviewed. Constitutional: She is oriented to person, place, and time. She appears well-developed and well-nourished. No distress.  HENT:  Head: Normocephalic  and atraumatic.  Mouth/Throat: Oropharynx is clear and moist. No oropharyngeal exudate.  Eyes: Conjunctivae are normal. Right eye exhibits no discharge. Left eye exhibits no discharge. No scleral icterus.  Neck: Normal range of motion. Neck supple. No JVD present. No tracheal deviation present. No thyromegaly present.  Cardiovascular: Normal rate, regular rhythm, normal heart sounds and intact distal pulses.  Exam reveals no gallop and no friction rub.   No murmur heard. Pulmonary/Chest: Effort normal and breath sounds normal. No stridor. No respiratory distress. She has no wheezes. She has no rales. She exhibits no tenderness.  Abdominal: Soft. Bowel sounds are normal. She exhibits no distension and no mass. There is no tenderness. There is no rebound and no guarding.  Musculoskeletal: Normal range of motion. She exhibits edema (2+ edema in BLE). She exhibits no tenderness.  Lymphadenopathy:    She has no cervical adenopathy.  Neurological: She is alert and oriented to person, place, and time.  Skin: Skin is warm and dry. No rash noted. She is not diaphoretic. No erythema. No pallor.  Psychiatric: She has a normal mood and affect. Her behavior is normal. Judgment and thought content normal.      Lab Results  Component Value Date   WBC 3.1* 06/27/2012   HGB 11.8 06/27/2012   HCT 34.8 06/27/2012   PLT 137* 06/27/2012   GLUCOSE 84 06/27/2012   CHOL 132 03/12/2012   TRIG 68.0 03/12/2012   HDL 53.80 03/12/2012   LDLCALC 65 03/12/2012   ALT 26 06/27/2012   AST 36 06/27/2012   NA 140 06/27/2012   K 4.3 06/27/2012   CL 103 06/27/2012   CREATININE 1.41* 06/27/2012   BUN 42* 06/27/2012   CO2 30 06/27/2012   TSH 0.41 03/12/2012   INR 2.7 06/27/2012   HGBA1C 5.3 03/12/2012      Assessment & Plan:

## 2012-07-02 NOTE — Assessment & Plan Note (Signed)
No changes noted today 

## 2012-07-02 NOTE — Assessment & Plan Note (Signed)
This has improved.

## 2012-07-02 NOTE — Patient Instructions (Addendum)

## 2012-07-02 NOTE — Assessment & Plan Note (Signed)
Her BP is well controlled 

## 2012-07-02 NOTE — Assessment & Plan Note (Signed)
Her renal function is stable 

## 2012-07-25 ENCOUNTER — Telehealth: Payer: Self-pay | Admitting: Internal Medicine

## 2012-07-25 ENCOUNTER — Ambulatory Visit (INDEPENDENT_AMBULATORY_CARE_PROVIDER_SITE_OTHER): Payer: Medicare Other | Admitting: Pharmacist

## 2012-07-25 DIAGNOSIS — Z7901 Long term (current) use of anticoagulants: Secondary | ICD-10-CM

## 2012-07-25 DIAGNOSIS — I82409 Acute embolism and thrombosis of unspecified deep veins of unspecified lower extremity: Secondary | ICD-10-CM

## 2012-07-25 NOTE — Telephone Encounter (Signed)
Yes on both 

## 2012-07-25 NOTE — Telephone Encounter (Signed)
Received refill request from [facility] request refills for Omeprazole 20mg  and Pravastatin 40mg  . Rx for Omeprazole last written on 05/16/12 and Rx for Pravastatin is a historical med. Pt last seen on 06/14/12. Please advise.Thanks.

## 2012-07-29 MED ORDER — PRAVASTATIN SODIUM 40 MG PO TABS: ORAL_TABLET | ORAL | Status: DC

## 2012-07-29 MED ORDER — OMEPRAZOLE 20 MG PO CPDR: DELAYED_RELEASE_CAPSULE | ORAL | Status: DC

## 2012-08-06 ENCOUNTER — Encounter: Payer: Self-pay | Admitting: General Practice

## 2012-09-05 ENCOUNTER — Ambulatory Visit (INDEPENDENT_AMBULATORY_CARE_PROVIDER_SITE_OTHER): Payer: Medicare Other | Admitting: *Deleted

## 2012-09-05 DIAGNOSIS — I82409 Acute embolism and thrombosis of unspecified deep veins of unspecified lower extremity: Secondary | ICD-10-CM

## 2012-09-05 DIAGNOSIS — Z7901 Long term (current) use of anticoagulants: Secondary | ICD-10-CM

## 2012-09-30 ENCOUNTER — Other Ambulatory Visit: Payer: Self-pay | Admitting: *Deleted

## 2012-09-30 MED ORDER — WARFARIN SODIUM 5 MG PO TABS: 5.0000 mg | ORAL_TABLET | ORAL | Status: DC

## 2012-10-17 ENCOUNTER — Ambulatory Visit (INDEPENDENT_AMBULATORY_CARE_PROVIDER_SITE_OTHER): Payer: Medicare Other

## 2012-10-17 DIAGNOSIS — I82409 Acute embolism and thrombosis of unspecified deep veins of unspecified lower extremity: Secondary | ICD-10-CM

## 2012-10-17 DIAGNOSIS — Z7901 Long term (current) use of anticoagulants: Secondary | ICD-10-CM

## 2012-10-17 LAB — POCT INR: INR: 2.6

## 2012-11-05 ENCOUNTER — Encounter: Payer: Self-pay | Admitting: Internal Medicine

## 2012-11-05 ENCOUNTER — Other Ambulatory Visit (INDEPENDENT_AMBULATORY_CARE_PROVIDER_SITE_OTHER): Payer: Medicare Other

## 2012-11-05 ENCOUNTER — Ambulatory Visit (INDEPENDENT_AMBULATORY_CARE_PROVIDER_SITE_OTHER): Payer: Medicare Other | Admitting: Internal Medicine

## 2012-11-05 VITALS — BP 136/80 | HR 57 | Temp 98.3°F | Resp 16 | Ht 63.0 in | Wt 190.2 lb

## 2012-11-05 DIAGNOSIS — I10 Essential (primary) hypertension: Secondary | ICD-10-CM

## 2012-11-05 DIAGNOSIS — N289 Disorder of kidney and ureter, unspecified: Secondary | ICD-10-CM

## 2012-11-05 DIAGNOSIS — R7309 Other abnormal glucose: Secondary | ICD-10-CM

## 2012-11-05 DIAGNOSIS — M1710 Unilateral primary osteoarthritis, unspecified knee: Secondary | ICD-10-CM

## 2012-11-05 LAB — BASIC METABOLIC PANEL
CO2: 28 mEq/L (ref 19–32)
Chloride: 104 mEq/L (ref 96–112)
Glucose, Bld: 99 mg/dL (ref 70–99)
Potassium: 3.9 mEq/L (ref 3.5–5.1)
Sodium: 140 mEq/L (ref 135–145)

## 2012-11-05 MED ORDER — HYDROCODONE-ACETAMINOPHEN 5-325 MG PO TABS: 1.0000 | ORAL_TABLET | Freq: Four times a day (QID) | ORAL | Status: DC | PRN

## 2012-11-05 NOTE — Patient Instructions (Signed)

## 2012-11-05 NOTE — Assessment & Plan Note (Signed)
Her BP is well controlled I will check her lytes and renal function 

## 2012-11-05 NOTE — Assessment & Plan Note (Signed)
Try vicodin for pain She wants to see her ortho at [facility] again

## 2012-11-05 NOTE — Progress Notes (Signed)
Subjective:    Patient ID: Annette Mckay, female    DOB: December 25, 1926, 78 y.o.   MRN: [id number]  Hypertension This is a chronic problem. The current episode started more than 1 year ago. The problem has been gradually improving since onset. The problem is controlled. Associated symptoms include peripheral edema. Pertinent negatives include no anxiety, blurred vision, chest pain, headaches, malaise/fatigue, neck pain, orthopnea, palpitations, PND, shortness of breath or sweats. Past treatments include central alpha agonists, angiotensin blockers, diuretics and beta blockers. The current treatment provides significant improvement. Compliance problems include diet and exercise.       Review of Systems  Constitutional: Negative for fever, chills, malaise/fatigue, diaphoresis, activity change, appetite change, fatigue and unexpected weight change.  HENT: Negative.  Negative for neck pain.   Eyes: Negative.  Negative for blurred vision.  Respiratory: Negative for cough, chest tightness, shortness of breath, wheezing and stridor.   Cardiovascular: Negative for chest pain, palpitations, orthopnea, leg swelling and PND.  Gastrointestinal: Negative.  Negative for nausea, vomiting, abdominal pain, diarrhea, constipation and anal bleeding.  Genitourinary: Negative.   Musculoskeletal: Positive for arthralgias (bilateral knee pain). Negative for myalgias, back pain, joint swelling and gait problem.  Skin: Negative.   Neurological: Negative.  Negative for headaches.  Hematological: Negative for adenopathy. Does not bruise/bleed easily.  Psychiatric/Behavioral: Negative.        Objective:   Physical Exam  Vitals reviewed. Constitutional: She is oriented to person, place, and time. She appears well-developed and well-nourished. No distress.  HENT:  Head: Normocephalic and atraumatic.  Mouth/Throat: Oropharynx is clear and moist. No oropharyngeal exudate.  Eyes: Conjunctivae normal are normal. Right  eye exhibits no discharge. Left eye exhibits no discharge. No scleral icterus.  Neck: Normal range of motion. Neck supple. No JVD present. No tracheal deviation present. No thyromegaly present.  Cardiovascular: Normal rate, regular rhythm, normal heart sounds and intact distal pulses.  Exam reveals no gallop and no friction rub.   No murmur heard. Pulmonary/Chest: Effort normal and breath sounds normal. No stridor. No respiratory distress. She has no wheezes. She has no rales. She exhibits no tenderness.  Abdominal: Soft. Bowel sounds are normal. She exhibits no distension. There is no tenderness. There is no rebound and no guarding.  Musculoskeletal: She exhibits edema (1+ edema in BLE). She exhibits no tenderness.       Right shoulder: She exhibits normal range of motion, no tenderness, no bony tenderness, no swelling, no effusion, no crepitus, no deformity (djd) and no pain.       Right knee: She exhibits deformity (djd). She exhibits normal range of motion, no swelling, no effusion, no ecchymosis, no erythema, normal alignment, no LCL laxity, normal patellar mobility and no bony tenderness. no tenderness found.       Left knee: She exhibits deformity (djd). She exhibits normal range of motion, no swelling, no effusion, no ecchymosis, no erythema, normal alignment, no LCL laxity, normal patellar mobility and no bony tenderness. no tenderness found.  Lymphadenopathy:    She has no cervical adenopathy.  Neurological: She is oriented to person, place, and time.  Skin: Skin is warm and dry. No rash noted. She is not diaphoretic. No erythema. No pallor.  Psychiatric: She has a normal mood and affect. Her behavior is normal. Judgment and thought content normal.     Lab Results  Component Value Date   WBC 3.1* 06/27/2012   HGB 11.8 06/27/2012   HCT 34.8 06/27/2012   PLT 137* 06/27/2012  GLUCOSE 84 06/27/2012   CHOL 132 03/12/2012   TRIG 68.0 03/12/2012   HDL 53.80 03/12/2012   LDLCALC 65 03/12/2012    ALT 26 06/27/2012   AST 36 06/27/2012   NA 140 06/27/2012   K 4.3 06/27/2012   CL 103 06/27/2012   CREATININE 1.41* 06/27/2012   BUN 42* 06/27/2012   CO2 30 06/27/2012   TSH 0.41 03/12/2012   INR 2.6 10/17/2012   HGBA1C 5.3 03/12/2012       Assessment & Plan:

## 2012-11-05 NOTE — Assessment & Plan Note (Addendum)
I will recheck her renal function today and will address if needed

## 2012-11-11 ENCOUNTER — Other Ambulatory Visit: Payer: Self-pay | Admitting: Internal Medicine

## 2012-11-28 ENCOUNTER — Ambulatory Visit (INDEPENDENT_AMBULATORY_CARE_PROVIDER_SITE_OTHER): Payer: Medicare Other | Admitting: *Deleted

## 2012-11-28 DIAGNOSIS — I82409 Acute embolism and thrombosis of unspecified deep veins of unspecified lower extremity: Secondary | ICD-10-CM

## 2012-11-28 DIAGNOSIS — Z7901 Long term (current) use of anticoagulants: Secondary | ICD-10-CM

## 2012-11-28 LAB — POCT INR: INR: 2.7

## 2012-12-07 ENCOUNTER — Other Ambulatory Visit: Payer: Self-pay | Admitting: Internal Medicine

## 2012-12-29 ENCOUNTER — Telehealth: Payer: Self-pay | Admitting: *Deleted

## 2012-12-29 DIAGNOSIS — I10 Essential (primary) hypertension: Secondary | ICD-10-CM

## 2012-12-29 DIAGNOSIS — D638 Anemia in other chronic diseases classified elsewhere: Secondary | ICD-10-CM

## 2012-12-29 MED ORDER — CLONIDINE HCL 0.2 MG PO TABS: 0.2000 mg | ORAL_TABLET | Freq: Two times a day (BID) | ORAL | Status: DC

## 2012-12-29 NOTE — Telephone Encounter (Signed)
done

## 2012-12-29 NOTE — Telephone Encounter (Signed)
Received fax refill request from Pharmacy. Pt needs refill on Clonidine 0.2 mg tab. Please advise.

## 2012-12-30 ENCOUNTER — Other Ambulatory Visit: Payer: Medicare Other | Admitting: Lab

## 2012-12-30 ENCOUNTER — Ambulatory Visit: Payer: Medicare Other | Admitting: Oncology

## 2013-01-09 ENCOUNTER — Ambulatory Visit (INDEPENDENT_AMBULATORY_CARE_PROVIDER_SITE_OTHER): Payer: Medicare Other | Admitting: *Deleted

## 2013-01-09 DIAGNOSIS — I82409 Acute embolism and thrombosis of unspecified deep veins of unspecified lower extremity: Secondary | ICD-10-CM

## 2013-01-09 DIAGNOSIS — Z7901 Long term (current) use of anticoagulants: Secondary | ICD-10-CM

## 2013-02-15 ENCOUNTER — Other Ambulatory Visit: Payer: Self-pay | Admitting: Internal Medicine

## 2013-03-06 ENCOUNTER — Ambulatory Visit: Payer: Medicare Other | Admitting: Internal Medicine

## 2013-03-06 DIAGNOSIS — Z0289 Encounter for other administrative examinations: Secondary | ICD-10-CM

## 2013-04-17 ENCOUNTER — Encounter (HOSPITAL_COMMUNITY): Payer: Self-pay | Admitting: Emergency Medicine

## 2013-04-17 ENCOUNTER — Ambulatory Visit (INDEPENDENT_AMBULATORY_CARE_PROVIDER_SITE_OTHER): Payer: Medicare Other | Admitting: *Deleted

## 2013-04-17 ENCOUNTER — Other Ambulatory Visit: Payer: Self-pay | Admitting: General Practice

## 2013-04-17 ENCOUNTER — Emergency Department (INDEPENDENT_AMBULATORY_CARE_PROVIDER_SITE_OTHER)
Admission: EM | Admit: 2013-04-17 | Discharge: 2013-04-17 | Disposition: A | Discharge status: Home / Self Care | Payer: Medicare Other | Source: Home / Self Care | Attending: Family Medicine | Admitting: Family Medicine

## 2013-04-17 ENCOUNTER — Emergency Department (INDEPENDENT_AMBULATORY_CARE_PROVIDER_SITE_OTHER): Payer: Medicare Other

## 2013-04-17 DIAGNOSIS — S52501A Unspecified fracture of the lower end of right radius, initial encounter for closed fracture: Secondary | ICD-10-CM

## 2013-04-17 DIAGNOSIS — S5290XD Unspecified fracture of unspecified forearm, subsequent encounter for closed fracture with routine healing: Secondary | ICD-10-CM

## 2013-04-17 DIAGNOSIS — I82409 Acute embolism and thrombosis of unspecified deep veins of unspecified lower extremity: Secondary | ICD-10-CM

## 2013-04-17 DIAGNOSIS — S52599A Other fractures of lower end of unspecified radius, initial encounter for closed fracture: Secondary | ICD-10-CM

## 2013-04-17 DIAGNOSIS — Z7901 Long term (current) use of anticoagulants: Secondary | ICD-10-CM

## 2013-04-17 DIAGNOSIS — S52611G Displaced fracture of right ulna styloid process, subsequent encounter for closed fracture with delayed healing: Secondary | ICD-10-CM

## 2013-04-17 MED ORDER — WARFARIN SODIUM 5 MG PO TABS: 5.0000 mg | ORAL_TABLET | ORAL | Status: DC

## 2013-04-17 NOTE — ED Provider Notes (Signed)
Medical screening examination/treatment/procedure(s) were performed by resident physician or non-physician practitioner and as supervising physician I was immediately available for consultation/collaboration.   Barkley Bruns MD.   Linna Hoff, MD 04/17/13 [phone]

## 2013-04-17 NOTE — ED Provider Notes (Signed)
History     CSN: [id number]  Arrival date & time 04/17/13  1753   First MD Initiated Contact with Patient 04/17/13 2017      Chief Complaint  Patient presents with  . Fall    (Consider location/radiation/quality/duration/timing/severity/associated sxs/prior treatment) Patient is a 79 y.o. female presenting with fall. The history is provided by the patient.  Fall The accident occurred 2 days ago. Fall occurred: pt was looking for something behind couch when lost balance. The point of impact was the right wrist. The pain is present in the right wrist. The pain is at a severity of 9/10. Pertinent negatives include no numbness. The symptoms are aggravated by use of the injured limb. She has tried ice for the symptoms. The treatment provided no relief.    Past Medical History  Diagnosis Date  . ACUTE VENOUS EMBO & THROMB SUP VEINS UPPER EXT 08/01/2010  . ANEMIA-NOS 11/09/2010  . ARTHRALGIA 08/01/2010  . BACK PAIN, LUMBAR 08/01/2010  . CONSTIPATION 08/01/2010  . DEPRESSION 08/01/2010  . DVT 08/08/2010  . ECZEMA, ATOPIC 08/01/2010  . Edema 08/08/2010  . ESOPHAGEAL STRICTURE 08/01/2010  . GERD 08/01/2010  . HYPERCHOLESTEROLEMIA 08/01/2010  . HYPERGLYCEMIA 08/01/2010  . Obesity, unspecified 08/01/2010  . RENAL DISEASE 08/01/2010  . Senile dementia, uncomplicated 11/09/2010    Past Surgical History  Procedure Laterality Date  . Blood clots-removed  1985  . Tumor removed  1980    Family History  Problem Relation Age of Onset  . Diabetes Brother   . Cancer Brother     colon    History  Substance Use Topics  . Smoking status: Never Smoker   . Smokeless tobacco: Never Used  . Alcohol Use: 3.0 oz/week    5 Shots of liquor per week    OB History   Grav Para Term Preterm Abortions TAB SAB Ect Mult Living                  Review of Systems  Musculoskeletal: Positive for joint swelling.       R wrist pain  Skin: Negative for wound.  Neurological: Negative for numbness.     Allergies  Amlodipine and Tramadol  Home Medications   Current Outpatient Rx  Name  Route  Sig  Dispense  Refill  . Calcium Carb-Cholecalciferol (CALCIUM 500 +D) 500-400 MG-UNIT TABS   Oral   Take by mouth 3 (three) times daily.           . CELEXA 10 MG tablet      TAKE ONE TABLET BY MOUTH EVERY DAY FOR DEPRESSION.   90 each   3   . cloNIDine (CATAPRES) 0.2 MG tablet   Oral   Take 1 tablet (0.2 mg total) by mouth 2 (two) times daily.   180 tablet   3   . ferrous sulfate 325 (65 FE) MG tablet   Oral   Take 325 mg by mouth daily with breakfast.         . HYDROcodone-acetaminophen (NORCO/VICODIN) 5-325 MG per tablet   Oral   Take 1 tablet by mouth every 6 (six) hours as needed for pain.   65 tablet   3   . losartan-hydrochlorothiazide (HYZAAR) 100-25 MG per tablet      TAKE ONE TABLET BY MOUTH DAILY FOR HIGH BLOOD PRESSURE.   90 tablet   3   . nebivolol (BYSTOLIC) 5 MG tablet   Oral   Take 1 tablet (5 mg total) by mouth daily.  70 tablet   0   . omeprazole (PRILOSEC) 20 MG capsule      TAKE ONE CAPSULE BY MOUTH EVERY DAY FOR INDIGESTION.   90 capsule   3   . potassium chloride SA (K-DUR,KLOR-CON) 20 MEQ tablet      TAKE ONE TABLET BY MOUTH 2 TIMES A DAY.   180 tablet   1   . pravastatin (PRAVACHOL) 40 MG tablet      TAKE ONE TABLET BY MOUTH AT BEDTIME FORCHOLESTEROL.   90 tablet   3   . warfarin (COUMADIN) 5 MG tablet   Oral   Take 1 tablet (5 mg total) by mouth as directed. Take As Directed by [facility]   35 tablet   1   . EXPIRED: fexofenadine (ALLEGRA) 180 MG tablet   Oral   Take 1 tablet (180 mg total) by mouth daily.   90 tablet   3   . EXPIRED: solifenacin (VESICARE) 5 MG tablet   Oral   Take 1 tablet (5 mg total) by mouth daily.   42 tablet   0     BP 197/84  Pulse 80  Temp(Src) 98.3 F (36.8 C) (Oral)  Resp 22  SpO2 99%  Physical Exam  Constitutional: She appears well-developed and well-nourished.   Cardiovascular:  Pulses:      Radial pulses are 2+ on the right side.  Musculoskeletal:       Right wrist: She exhibits tenderness, bony tenderness, swelling and deformity.  Swelling mostly over radial aspect of wrist, and this is area of greatest tenderness.  Ulnar aspect also tender to palp.   Skin: Skin is warm, dry and intact.    ED Course  Procedures (including critical care time)  Labs Reviewed - No data to display Dg Wrist Complete Right  04/17/2013   *RADIOLOGY REPORT*  Clinical Data: Pain and swelling status post fall 3 days ago.  RIGHT WRIST - COMPLETE 3+ VIEW  Comparison: None.  Findings: The bones are osteopenic.  There is an acute transversely oriented and impacted fracture of the distal radius.  The lucency appears to extend into the radiocarpal joint.  There is slight buckle in the posterior cortex of the radius on the lateral view but no significant angulation of fracture.  There is an acute fracture of the ulna styloid.  There is soft tissue swelling about the wrist.  The carpals are located.  There is significant degenerative changes at the first carpometacarpal joint.  Peripheral vascular atherosclerotic calcifications are present.  Extensive osteoarthritis identified to joint of the thumb that appear  IMPRESSION:  1.  Acute, transversely oriented impacted distal radius fracture. The fracture appears to extend into the radiocarpal joint. Adjacent soft tissue swelling.                                          2. Acute fracture of the ulna styloid. 3.  Peripheral vascular disease and osteoarthritis.   Original Report Authenticated By: Britta Mccreedy, M.D.     1. Distal radial fracture, right, closed, initial encounter   2. Fracture of ulnar styloid, right, closed, with delayed healing, subsequent encounter       MDM  Spoke with Dr. Mack Hook, on call hand specialist from [facility].  He requested sugartong splint for pt's injury, will have his office call pt with  f/u appt. Pt has hydrocodone/apap  at home from own MD, pt is to use that if needed for pain.         Cathlyn Parsons, NP 04/17/13 2041

## 2013-04-17 NOTE — Progress Notes (Signed)
Orthopedic Tech Progress Note Patient Details:  Annette Mckay 12-18-1926 [id number]  Ortho Devices Type of Ortho Device: Ace wrap;Arm sling;Sugartong splint Ortho Device/Splint Location: right arm Ortho Device/Splint Interventions: Application   [name] 04/17/2013, 8:27 PM

## 2013-04-17 NOTE — ED Notes (Signed)
Pt c/o fall x 3 days ago. Was reaching for something and fell over. Landed on right hand. Is sore and swollen. Patient is alert and oriented.

## 2013-05-03 IMAGING — CR DG FOOT COMPLETE 3+V*R*
3 series · 3 of 3 positions shown · non-contrast
Comparison: None.

CLINICAL DATA: Pain and tingling without trauma

RIGHT FOOT COMPLETE - 3+ VIEW

[view not recorded (1 of 3)]
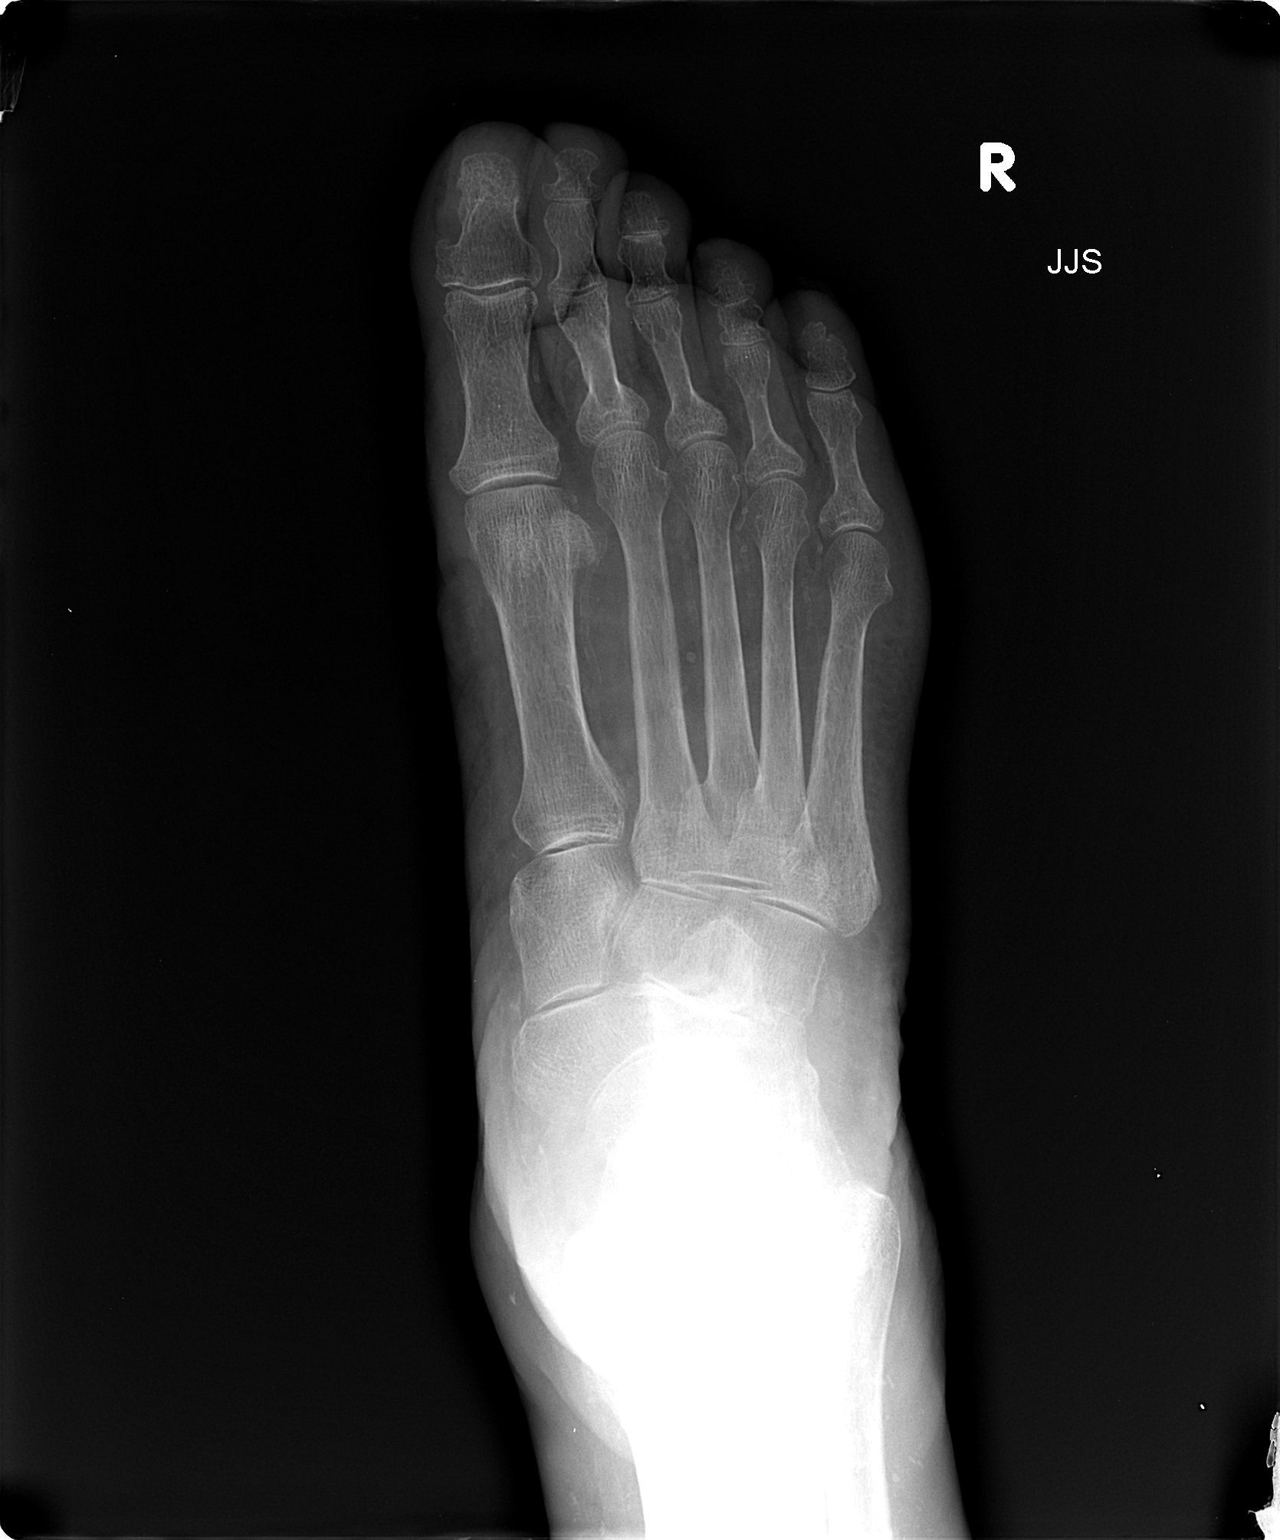

[view not recorded (2 of 3)]
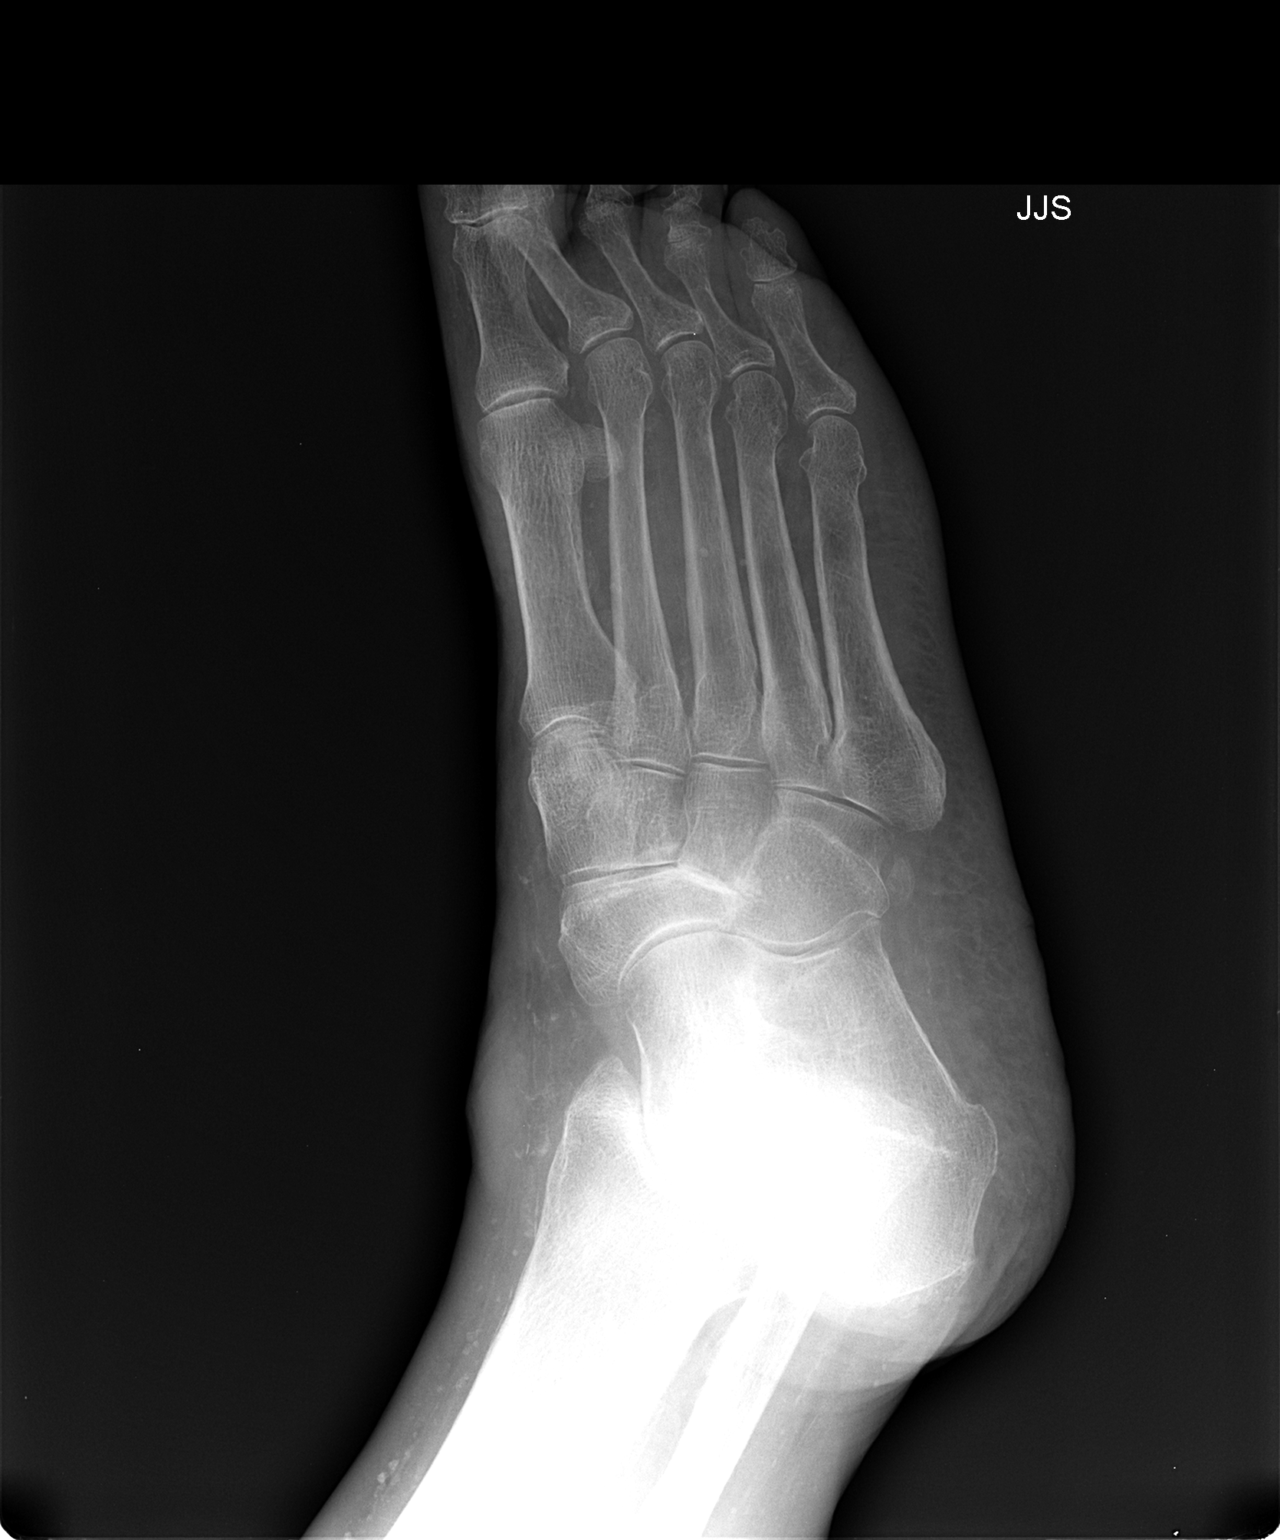

[view not recorded (3 of 3)]
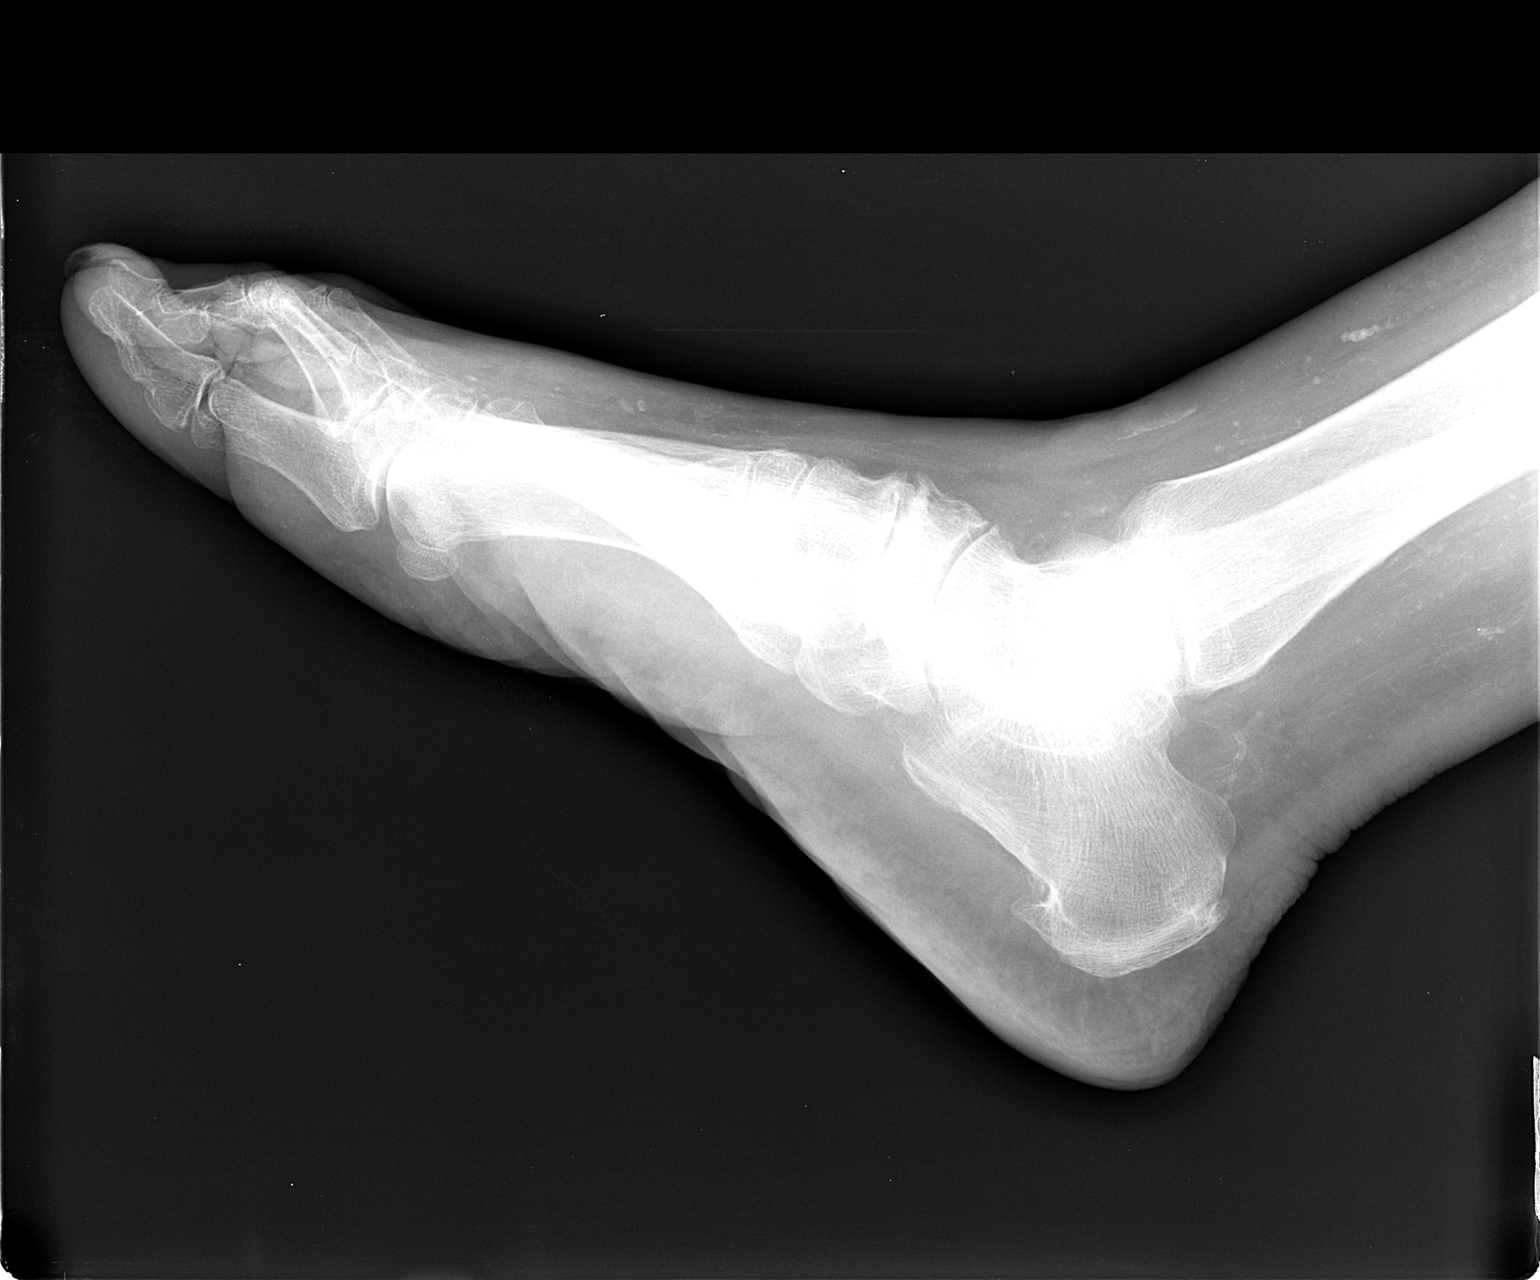

[3 of 3 positions shown; findings below may reference images not displayed]

FINDINGS: Calcaneal spurs at the plantar aponeurosis and Achilles
tendon.  Scattered vascular calcifications.  Diffuse osteopenia.
Normal alignment.  Negative for fracture.  No other significant
osseous degenerative change.
IMPRESSION: 1.  Osteopenia without fracture or other acute bony abnormality.
2.  Calcaneal spurs

## 2013-05-29 ENCOUNTER — Telehealth: Payer: Self-pay | Admitting: Internal Medicine

## 2013-05-29 ENCOUNTER — Ambulatory Visit (INDEPENDENT_AMBULATORY_CARE_PROVIDER_SITE_OTHER): Payer: Medicare Other | Admitting: General Practice

## 2013-05-29 DIAGNOSIS — M1710 Unilateral primary osteoarthritis, unspecified knee: Secondary | ICD-10-CM

## 2013-05-29 DIAGNOSIS — Z7901 Long term (current) use of anticoagulants: Secondary | ICD-10-CM

## 2013-05-29 DIAGNOSIS — I82409 Acute embolism and thrombosis of unspecified deep veins of unspecified lower extremity: Secondary | ICD-10-CM

## 2013-05-29 LAB — POCT INR: INR: 3.1

## 2013-05-29 NOTE — Telephone Encounter (Signed)
Pt request refill for Hydrocodone. Please advise.

## 2013-06-01 MED ORDER — HYDROCODONE-ACETAMINOPHEN 5-325 MG PO TABS: 1.0000 | ORAL_TABLET | Freq: Four times a day (QID) | ORAL | Status: DC | PRN

## 2013-06-01 NOTE — Telephone Encounter (Signed)
done

## 2013-06-01 NOTE — Telephone Encounter (Signed)
RX faxed to pharmacy.

## 2013-06-17 ENCOUNTER — Other Ambulatory Visit: Payer: Self-pay | Admitting: *Deleted

## 2013-06-17 MED ORDER — WARFARIN SODIUM 5 MG PO TABS: 5.0000 mg | ORAL_TABLET | ORAL | Status: DC

## 2013-06-26 ENCOUNTER — Ambulatory Visit: Payer: Medicare Other | Admitting: Internal Medicine

## 2013-06-26 DIAGNOSIS — Z0289 Encounter for other administrative examinations: Secondary | ICD-10-CM

## 2013-07-27 ENCOUNTER — Other Ambulatory Visit: Payer: Self-pay

## 2013-07-27 MED ORDER — POTASSIUM CHLORIDE CRYS ER 20 MEQ PO TBCR: EXTENDED_RELEASE_TABLET | ORAL | Status: DC

## 2013-07-28 ENCOUNTER — Other Ambulatory Visit: Payer: Self-pay

## 2013-07-28 MED ORDER — CITALOPRAM HYDROBROMIDE 10 MG PO TABS: ORAL_TABLET | ORAL | Status: DC

## 2013-07-28 MED ORDER — OMEPRAZOLE 20 MG PO CPDR: DELAYED_RELEASE_CAPSULE | ORAL | Status: DC

## 2013-07-28 MED ORDER — PRAVASTATIN SODIUM 40 MG PO TABS: ORAL_TABLET | ORAL | Status: DC

## 2013-07-29 ENCOUNTER — Ambulatory Visit (INDEPENDENT_AMBULATORY_CARE_PROVIDER_SITE_OTHER): Payer: Medicare Other | Admitting: General Practice

## 2013-07-29 ENCOUNTER — Encounter: Payer: Self-pay | Admitting: Internal Medicine

## 2013-07-29 ENCOUNTER — Other Ambulatory Visit (INDEPENDENT_AMBULATORY_CARE_PROVIDER_SITE_OTHER): Payer: Medicare Other

## 2013-07-29 ENCOUNTER — Ambulatory Visit (INDEPENDENT_AMBULATORY_CARE_PROVIDER_SITE_OTHER): Payer: Medicare Other | Admitting: Internal Medicine

## 2013-07-29 VITALS — BP 146/74 | HR 69 | Temp 98.5°F | Resp 16 | Wt 183.0 lb

## 2013-07-29 DIAGNOSIS — Z23 Encounter for immunization: Secondary | ICD-10-CM

## 2013-07-29 DIAGNOSIS — F0390 Unspecified dementia without behavioral disturbance: Secondary | ICD-10-CM

## 2013-07-29 DIAGNOSIS — I10 Essential (primary) hypertension: Secondary | ICD-10-CM

## 2013-07-29 DIAGNOSIS — D61818 Other pancytopenia: Secondary | ICD-10-CM

## 2013-07-29 DIAGNOSIS — M545 Low back pain: Secondary | ICD-10-CM

## 2013-07-29 DIAGNOSIS — I82409 Acute embolism and thrombosis of unspecified deep veins of unspecified lower extremity: Secondary | ICD-10-CM

## 2013-07-29 DIAGNOSIS — E7800 Pure hypercholesterolemia, unspecified: Secondary | ICD-10-CM

## 2013-07-29 DIAGNOSIS — Z7901 Long term (current) use of anticoagulants: Secondary | ICD-10-CM

## 2013-07-29 DIAGNOSIS — R7309 Other abnormal glucose: Secondary | ICD-10-CM

## 2013-07-29 DIAGNOSIS — M1710 Unilateral primary osteoarthritis, unspecified knee: Secondary | ICD-10-CM

## 2013-07-29 LAB — CBC WITH DIFFERENTIAL/PLATELET
Basophils Absolute: 0 10*3/uL (ref 0.0–0.1)
Eosinophils Absolute: 0 10*3/uL (ref 0.0–0.7)
Lymphocytes Relative: 28 % (ref 12.0–46.0)
MCHC: 34.6 g/dL (ref 30.0–36.0)
Monocytes Relative: 8.6 % (ref 3.0–12.0)
Neutrophils Relative %: 61.5 % (ref 43.0–77.0)
RBC: 3.37 Mil/uL — ABNORMAL LOW (ref 3.87–5.11)
RDW: 13.2 % (ref 11.5–14.6)

## 2013-07-29 MED ORDER — NEBIVOLOL HCL 5 MG PO TABS: 5.0000 mg | ORAL_TABLET | Freq: Every day | ORAL | Status: DC

## 2013-07-29 MED ORDER — HYDROCODONE-ACETAMINOPHEN 5-325 MG PO TABS: 1.0000 | ORAL_TABLET | Freq: Four times a day (QID) | ORAL | Status: DC | PRN

## 2013-07-29 NOTE — Assessment & Plan Note (Signed)
Repeat CBC today, will check to see if she has developed a lymphoproliferative disease

## 2013-07-29 NOTE — Assessment & Plan Note (Signed)
Her BP is adequately well controlled I will check her lytes and renal function today 

## 2013-07-29 NOTE — Patient Instructions (Signed)

## 2013-07-29 NOTE — Progress Notes (Signed)
  Subjective:    Patient ID: Annette Mckay, female    DOB: [birth year], 79 y.o.   MRN: [id number]  Hypertension This is a chronic problem. The current episode started more than 1 year ago. The problem has been gradually improving since onset. The problem is controlled. Pertinent negatives include no anxiety, blurred vision, chest pain, headaches, malaise/fatigue, neck pain, orthopnea, palpitations, peripheral edema, PND, shortness of breath or sweats. Past treatments include beta blockers, angiotensin blockers, central alpha agonists and diuretics. The current treatment provides moderate improvement. Compliance problems include exercise and diet.       Review of Systems  Constitutional: Negative.  Negative for fever, chills, malaise/fatigue, diaphoresis, appetite change and fatigue.  HENT: Negative.  Negative for neck pain.   Eyes: Negative.  Negative for blurred vision.  Respiratory: Negative.  Negative for apnea, cough, choking, shortness of breath, wheezing and stridor.   Cardiovascular: Negative.  Negative for chest pain, palpitations, orthopnea, leg swelling and PND.  Gastrointestinal: Negative.  Negative for nausea, vomiting, abdominal pain, diarrhea, constipation and blood in stool.  Endocrine: Negative.   Genitourinary: Negative.   Musculoskeletal: Negative.  Negative for myalgias, back pain, joint swelling and gait problem.  Skin: Negative.   Allergic/Immunologic: Negative.   Neurological: Negative.  Negative for dizziness, tremors, weakness, light-headedness and headaches.  Hematological: Negative.  Negative for adenopathy. Does not bruise/bleed easily.  Psychiatric/Behavioral: Negative.        Objective:   Physical Exam  Vitals reviewed. Constitutional: She is oriented to person, place, and time. She appears well-developed and well-nourished. No distress.  HENT:  Head: Normocephalic and atraumatic.  Mouth/Throat: Oropharynx is clear and moist. No oropharyngeal exudate.   Eyes: Conjunctivae are normal. Right eye exhibits no discharge. Left eye exhibits no discharge. No scleral icterus.  Neck: Normal range of motion. Neck supple. No JVD present. No tracheal deviation present. No thyromegaly present.  Cardiovascular: Normal rate, regular rhythm, normal heart sounds and intact distal pulses.  Exam reveals no gallop and no friction rub.   No murmur heard. Pulmonary/Chest: Effort normal and breath sounds normal. No stridor. No respiratory distress. She has no wheezes. She has no rales. She exhibits no tenderness.  Abdominal: Soft. Bowel sounds are normal. She exhibits no distension and no mass. There is no tenderness. There is no rebound and no guarding.  Musculoskeletal: Normal range of motion. She exhibits no edema and no tenderness.  Lymphadenopathy:    She has no cervical adenopathy.  Neurological: She is oriented to person, place, and time.  Skin: Skin is warm and dry. No rash noted. She is not diaphoretic. No erythema. No pallor.  Psychiatric: She has a normal mood and affect. Her behavior is normal. Judgment and thought content normal.     Lab Results  Component Value Date   WBC 3.1* 06/27/2012   HGB 11.8 06/27/2012   HCT 34.8 06/27/2012   PLT 137* 06/27/2012   GLUCOSE 99 11/05/2012   CHOL 132 03/12/2012   TRIG 68.0 03/12/2012   HDL 53.80 03/12/2012   LDLCALC 65 03/12/2012   ALT 26 06/27/2012   AST 36 06/27/2012   NA 140 11/05/2012   K 3.9 11/05/2012   CL 104 11/05/2012   CREATININE 1.2 11/05/2012   BUN 31* 11/05/2012   CO2 28 11/05/2012   TSH 0.41 03/12/2012   INR 3.1 05/29/2013   HGBA1C 5.3 03/12/2012       Assessment & Plan:

## 2013-07-29 NOTE — Assessment & Plan Note (Signed)
She is doing well on pravachol 

## 2013-07-29 NOTE — Assessment & Plan Note (Signed)
She will continue norco as needed for pain 

## 2013-07-30 LAB — LIPID PANEL
Cholesterol: 140 mg/dL (ref 0–200)
LDL Cholesterol: 68 mg/dL (ref 0–99)
Total CHOL/HDL Ratio: 2
VLDL: 11.4 mg/dL (ref 0.0–40.0)

## 2013-07-30 LAB — COMPREHENSIVE METABOLIC PANEL
ALT: 23 U/L (ref 0–35)
AST: 39 U/L — ABNORMAL HIGH (ref 0–37)
Albumin: 3.9 g/dL (ref 3.5–5.2)
CO2: 27 mEq/L (ref 19–32)
Calcium: 10.4 mg/dL (ref 8.4–10.5)
Chloride: 104 mEq/L (ref 96–112)
Potassium: 4.5 mEq/L (ref 3.5–5.1)
Total Protein: 7.9 g/dL (ref 6.0–8.3)

## 2013-08-07 ENCOUNTER — Telehealth: Payer: Self-pay

## 2013-08-07 MED ORDER — WARFARIN SODIUM 5 MG PO TABS: ORAL_TABLET | ORAL | Status: DC

## 2013-08-07 NOTE — Telephone Encounter (Signed)
Received refill request from [facility] requesting refills for Warfarin 5 mg .

## 2013-08-18 ENCOUNTER — Telehealth: Payer: Self-pay

## 2013-08-18 NOTE — Telephone Encounter (Signed)
Patient called requesting lab result letter and information on pancytopenia mailed out to her from most recent labs 07/29/13. Please advise on results. Thanks

## 2013-08-19 NOTE — Telephone Encounter (Signed)
Her last labs showed mild, stable to slightly improved bone marrow functioning Please send to her any labs that she wants to see

## 2013-08-19 NOTE — Telephone Encounter (Signed)
Lab letter and patient education mailed per pt request

## 2013-09-07 ENCOUNTER — Telehealth: Payer: Self-pay | Admitting: *Deleted

## 2013-09-07 DIAGNOSIS — M1710 Unilateral primary osteoarthritis, unspecified knee: Secondary | ICD-10-CM

## 2013-09-07 DIAGNOSIS — M545 Low back pain: Secondary | ICD-10-CM

## 2013-09-07 NOTE — Telephone Encounter (Signed)
ok 

## 2013-09-07 NOTE — Telephone Encounter (Signed)
Pt called requesting Hydrocodone refill.  Please advise 

## 2013-09-08 MED ORDER — HYDROCODONE-ACETAMINOPHEN 5-325 MG PO TABS: 1.0000 | ORAL_TABLET | Freq: Four times a day (QID) | ORAL | Status: DC | PRN

## 2013-09-09 ENCOUNTER — Ambulatory Visit (INDEPENDENT_AMBULATORY_CARE_PROVIDER_SITE_OTHER): Payer: Medicare Other | Admitting: General Practice

## 2013-09-09 DIAGNOSIS — Z7901 Long term (current) use of anticoagulants: Secondary | ICD-10-CM

## 2013-09-09 DIAGNOSIS — I82409 Acute embolism and thrombosis of unspecified deep veins of unspecified lower extremity: Secondary | ICD-10-CM

## 2013-09-09 NOTE — Telephone Encounter (Signed)
Rx called in, pt notified

## 2013-10-20 ENCOUNTER — Other Ambulatory Visit: Payer: Self-pay

## 2013-10-20 MED ORDER — WARFARIN SODIUM 5 MG PO TABS: ORAL_TABLET | ORAL | Status: DC

## 2013-10-20 NOTE — Telephone Encounter (Signed)
Warfarin 5 mg refilled

## 2013-10-21 ENCOUNTER — Ambulatory Visit (INDEPENDENT_AMBULATORY_CARE_PROVIDER_SITE_OTHER): Payer: Medicare Other | Admitting: General Practice

## 2013-10-21 ENCOUNTER — Telehealth: Payer: Self-pay

## 2013-10-21 DIAGNOSIS — Z7901 Long term (current) use of anticoagulants: Secondary | ICD-10-CM

## 2013-10-21 DIAGNOSIS — M1710 Unilateral primary osteoarthritis, unspecified knee: Secondary | ICD-10-CM

## 2013-10-21 DIAGNOSIS — I82409 Acute embolism and thrombosis of unspecified deep veins of unspecified lower extremity: Secondary | ICD-10-CM

## 2013-10-21 DIAGNOSIS — M545 Low back pain: Secondary | ICD-10-CM

## 2013-10-21 MED ORDER — HYDROCODONE-ACETAMINOPHEN 5-325 MG PO TABS: 1.0000 | ORAL_TABLET | Freq: Four times a day (QID) | ORAL | Status: DC | PRN

## 2013-10-21 NOTE — Telephone Encounter (Signed)
done

## 2013-10-21 NOTE — Progress Notes (Signed)
Pre-visit discussion using our clinic review tool. No additional management support is needed unless otherwise documented below in the visit note.  

## 2013-10-21 NOTE — Telephone Encounter (Signed)
Pt here being seen for coumadin check and would like to know if she may also pick up rx for hydrocodone, last filled 09/08/13. Please advise Thanks

## 2013-10-21 NOTE — Telephone Encounter (Signed)
RX placed up front

## 2013-11-06 ENCOUNTER — Ambulatory Visit (INDEPENDENT_AMBULATORY_CARE_PROVIDER_SITE_OTHER): Payer: Medicare Other | Admitting: General Practice

## 2013-11-06 ENCOUNTER — Ambulatory Visit (INDEPENDENT_AMBULATORY_CARE_PROVIDER_SITE_OTHER): Payer: Medicare Other | Admitting: Internal Medicine

## 2013-11-06 ENCOUNTER — Encounter: Payer: Self-pay | Admitting: Internal Medicine

## 2013-11-06 VITALS — BP 120/84 | HR 60 | Temp 98.2°F | Resp 16 | Ht 63.0 in | Wt 183.0 lb

## 2013-11-06 DIAGNOSIS — M1710 Unilateral primary osteoarthritis, unspecified knee: Secondary | ICD-10-CM

## 2013-11-06 DIAGNOSIS — H269 Unspecified cataract: Secondary | ICD-10-CM

## 2013-11-06 DIAGNOSIS — I82409 Acute embolism and thrombosis of unspecified deep veins of unspecified lower extremity: Secondary | ICD-10-CM

## 2013-11-06 DIAGNOSIS — M545 Low back pain: Secondary | ICD-10-CM

## 2013-11-06 DIAGNOSIS — Z7901 Long term (current) use of anticoagulants: Secondary | ICD-10-CM

## 2013-11-06 DIAGNOSIS — I10 Essential (primary) hypertension: Secondary | ICD-10-CM

## 2013-11-06 MED ORDER — HYDROCODONE-ACETAMINOPHEN 5-325 MG PO TABS: 1.0000 | ORAL_TABLET | Freq: Four times a day (QID) | ORAL | Status: DC | PRN

## 2013-11-06 NOTE — Progress Notes (Signed)
Subjective:    Patient ID: Annette Mckay, female    DOB: September 24, 1927, 79 y.o.   MRN: [id number]  Arthritis Presents for follow-up visit. The disease course has been worsening. She complains of pain. She reports no stiffness, joint swelling or joint warmth. Affected locations include the right knee. Her pain is at a severity of 3/10. Associated symptoms include fatigue, pain at night and pain while resting. Pertinent negatives include no diarrhea, dry eyes, dry mouth, dysuria, fever, rash, Raynaud's syndrome, uveitis or weight loss. Her past medical history is significant for osteoarthritis.      Review of Systems  Constitutional: Positive for fatigue. Negative for fever, chills, weight loss, diaphoresis, activity change, appetite change and unexpected weight change.  HENT: Negative.   Eyes: Negative.   Respiratory: Negative.  Negative for cough, chest tightness, shortness of breath, wheezing and stridor.   Cardiovascular: Negative.  Negative for chest pain, palpitations and leg swelling.  Gastrointestinal: Negative.  Negative for nausea, vomiting, abdominal pain, diarrhea, constipation and blood in stool.  Endocrine: Negative.   Genitourinary: Negative.  Negative for dysuria.  Musculoskeletal: Positive for arthralgias and arthritis. Negative for back pain, gait problem, joint swelling, myalgias, neck pain, neck stiffness and stiffness.  Skin: Negative.  Negative for color change, pallor, rash and wound.  Allergic/Immunologic: Negative.   Neurological: Negative.   Hematological: Negative.  Negative for adenopathy. Does not bruise/bleed easily.  Psychiatric/Behavioral: Negative.        Objective:   Physical Exam  Vitals reviewed. Constitutional: She is oriented to person, place, and time. She appears well-developed and well-nourished. No distress.  HENT:  Head: Normocephalic and atraumatic.  Mouth/Throat: Oropharynx is clear and moist. No oropharyngeal exudate.  Eyes: Conjunctivae  are normal. Right eye exhibits no discharge. Left eye exhibits no discharge. No scleral icterus.  Neck: Normal range of motion. Neck supple. No JVD present. No tracheal deviation present. No thyromegaly present.  Cardiovascular: Normal rate, regular rhythm, normal heart sounds and intact distal pulses.  Exam reveals no gallop and no friction rub.   No murmur heard. Pulmonary/Chest: Effort normal and breath sounds normal. No stridor. No respiratory distress. She has no wheezes. She has no rales. She exhibits no tenderness.  Abdominal: Soft. Bowel sounds are normal. She exhibits no distension and no mass. There is no tenderness. There is no rebound and no guarding.  Musculoskeletal: Normal range of motion. She exhibits no edema and no tenderness.       Right knee: She exhibits swelling and deformity (DJD changes.). She exhibits normal range of motion, no effusion, no ecchymosis, no laceration, no erythema, normal alignment and no LCL laxity. No tenderness found.  Her right knee was cleaned with betadine then prepped and draped in sterile fashion. From the lateral approach over the superior aspect of the patella I injected 1 cc of lidocaine with epi for local anesthesia then I injected 1/2 cc of depo-medrol (40mg ) and 1/2 cc of plain lidocaine into the joint space. There was no fluid on aspiration. She tolerated this well with no complications.  Lymphadenopathy:    She has no cervical adenopathy.  Neurological: She is oriented to person, place, and time.  Skin: Skin is warm and dry. No rash noted. She is not diaphoretic. No erythema. No pallor.  Psychiatric: She has a normal mood and affect. Her behavior is normal. Judgment and thought content normal.     Lab Results  Component Value Date   WBC 2.9* 07/29/2013   HGB 11.7*  07/29/2013   HCT 33.7* 07/29/2013   PLT 153.0 07/29/2013   GLUCOSE 92 07/29/2013   CHOL 140 07/29/2013   TRIG 57.0 07/29/2013   HDL 60.80 07/29/2013   LDLCALC 68 07/29/2013   ALT 23  07/29/2013   AST 39* 07/29/2013   NA 139 07/29/2013   K 4.5 07/29/2013   CL 104 07/29/2013   CREATININE 1.4* 07/29/2013   BUN 25* 07/29/2013   CO2 27 07/29/2013   TSH 0.71 07/29/2013   INR 4.5 11/06/2013   HGBA1C 5.3 03/12/2012       Assessment & Plan:

## 2013-11-06 NOTE — Progress Notes (Signed)
Pre-visit discussion using our clinic review tool. No additional management support is needed unless otherwise documented below in the visit note.  

## 2013-11-06 NOTE — Patient Instructions (Signed)
Osteoarthritis Osteoarthritis is the most common form of arthritis. It is redness, soreness, and swelling (inflammation) affecting the cartilage. Cartilage acts as a cushion, covering the ends of bones where they meet to form a joint. CAUSES  Over time, the cartilage begins to wear away. This causes bone to rub on bone. This produces pain and stiffness in the affected joints. Factors that contribute to this problem are:  Excessive body weight.  Age.  Overuse of joints. SYMPTOMS   People with osteoarthritis usually experience joint pain, swelling, or stiffness.  Over time, the joint may lose its normal shape.  Small deposits of bone (osteophytes) may grow on the edges of the joint.  Bits of bone or cartilage can break off and float inside the joint space. This may cause more pain and damage.  Osteoarthritis can lead to depression, anxiety, feelings of helplessness, and limitations on daily activities. The most commonly affected joints are in the:  Ends of the fingers.  Thumbs.  Neck.  Lower back.  Knees.  Hips. DIAGNOSIS  Diagnosis is mostly based on your symptoms and exam. Tests may be helpful, including:  X-rays of the affected joint.  A computerized magnetic scan (MRI).  Blood tests to rule out other types of arthritis.  Joint fluid tests. This involves using a needle to draw fluid from the joint and examining the fluid under a microscope. TREATMENT  Goals of treatment are to control pain, improve joint function, maintain a normal body weight, and maintain a healthy lifestyle. Treatment approaches may include:  A prescribed exercise program with rest and joint relief.  Weight control with nutritional education.  Pain relief techniques such as:  Properly applied heat and cold.  Electric pulses delivered to nerve endings under the skin (transcutaneous electrical nerve stimulation, TENS).  Massage.  Certain supplements. Ask your caregiver before using any  supplements, especially in combination with prescribed drugs.  Medicines to control pain, such as:  Acetaminophen.  Nonsteroidal anti-inflammatory drugs (NSAIDs), such as naproxen.  Narcotic or central-acting agents, such as tramadol. This drug carries a risk of addiction and is generally prescribed for short-term use.  Corticosteroids. These can be given orally or as injection. This is a short-term treatment, not recommended for routine use.  Surgery to reposition the bones and relieve pain (osteotomy) or to remove loose pieces of bone and cartilage. Joint replacement may be needed in advanced states of osteoarthritis. HOME CARE INSTRUCTIONS  Your caregiver can recommend specific types of exercise. These may include:  Strengthening exercises. These are done to strengthen the muscles that support joints affected by arthritis. They can be performed with weights or with exercise bands to add resistance.  Aerobic activities. These are exercises, such as brisk walking or low-impact aerobics, that get your heart pumping. They can help keep your lungs and circulatory system in shape.  Range-of-motion activities. These keep your joints limber.  Balance and agility exercises. These help you maintain daily living skills. Learning about your condition and being actively involved in your care will help improve the course of your osteoarthritis. SEEK MEDICAL CARE IF:   You feel hot or your skin turns red.  You develop a rash in addition to your joint pain.  You have an oral temperature above 102 F (38.9 C). FOR MORE INFORMATION  National Institute of Arthritis and Musculoskeletal and Skin Diseases: www.niams.nih.gov National Institute on Aging: www.nia.nih.gov American College of Rheumatology: www.rheumatology.org Document Released: 11/19/2005 Document Revised: 02/11/2012 Document Reviewed: 03/02/2010 [employer] Patient Information 2014 [employer].  

## 2013-11-08 ENCOUNTER — Encounter: Payer: Self-pay | Admitting: Internal Medicine

## 2013-11-08 NOTE — Assessment & Plan Note (Signed)
This joint was injected today She will continue norco as needed for pain

## 2013-11-08 NOTE — Assessment & Plan Note (Signed)
ophth referral 

## 2013-11-08 NOTE — Assessment & Plan Note (Signed)
Her BP is well controlled 

## 2013-11-09 MED ORDER — METHYLPREDNISOLONE ACETATE 80 MG/ML IJ SUSP: 120.0000 mg | Freq: Once | INTRAMUSCULAR | Status: DC

## 2013-11-09 NOTE — Addendum Note (Signed)
Addended by: Rock Nephew T on: 11/09/2013 09:57 AM   Modules accepted: Orders

## 2013-11-18 ENCOUNTER — Ambulatory Visit (INDEPENDENT_AMBULATORY_CARE_PROVIDER_SITE_OTHER): Payer: Medicare Other | Admitting: General Practice

## 2013-11-18 DIAGNOSIS — Z7901 Long term (current) use of anticoagulants: Secondary | ICD-10-CM

## 2013-11-18 DIAGNOSIS — Z5181 Encounter for therapeutic drug level monitoring: Secondary | ICD-10-CM

## 2013-11-18 DIAGNOSIS — I82409 Acute embolism and thrombosis of unspecified deep veins of unspecified lower extremity: Secondary | ICD-10-CM

## 2013-11-18 NOTE — Progress Notes (Signed)
Pre-visit discussion using our clinic review tool. No additional management support is needed unless otherwise documented below in the visit note.  

## 2013-12-21 ENCOUNTER — Telehealth: Payer: Self-pay

## 2013-12-21 NOTE — Telephone Encounter (Signed)
Received fax request for pharmacy for warfarin 5mg . Thanks

## 2013-12-22 ENCOUNTER — Other Ambulatory Visit: Payer: Self-pay | Admitting: General Practice

## 2013-12-22 MED ORDER — WARFARIN SODIUM 5 MG PO TABS: ORAL_TABLET | ORAL | Status: DC

## 2014-01-07 ENCOUNTER — Ambulatory Visit: Payer: Medicare Other | Admitting: Internal Medicine

## 2014-01-08 ENCOUNTER — Encounter: Payer: Self-pay | Admitting: Internal Medicine

## 2014-01-08 ENCOUNTER — Ambulatory Visit (INDEPENDENT_AMBULATORY_CARE_PROVIDER_SITE_OTHER): Payer: Medicare Other | Admitting: General Practice

## 2014-01-08 ENCOUNTER — Ambulatory Visit (INDEPENDENT_AMBULATORY_CARE_PROVIDER_SITE_OTHER): Payer: Medicare Other | Admitting: Internal Medicine

## 2014-01-08 VITALS — BP 122/80 | HR 55 | Temp 98.3°F | Resp 16 | Ht 63.0 in

## 2014-01-08 DIAGNOSIS — L602 Onychogryphosis: Secondary | ICD-10-CM

## 2014-01-08 DIAGNOSIS — M545 Low back pain, unspecified: Secondary | ICD-10-CM

## 2014-01-08 DIAGNOSIS — M1710 Unilateral primary osteoarthritis, unspecified knee: Secondary | ICD-10-CM

## 2014-01-08 DIAGNOSIS — I10 Essential (primary) hypertension: Secondary | ICD-10-CM

## 2014-01-08 DIAGNOSIS — Z7901 Long term (current) use of anticoagulants: Secondary | ICD-10-CM

## 2014-01-08 DIAGNOSIS — Z5181 Encounter for therapeutic drug level monitoring: Secondary | ICD-10-CM | POA: Insufficient documentation

## 2014-01-08 DIAGNOSIS — I82409 Acute embolism and thrombosis of unspecified deep veins of unspecified lower extremity: Secondary | ICD-10-CM

## 2014-01-08 DIAGNOSIS — L608 Other nail disorders: Secondary | ICD-10-CM

## 2014-01-08 DIAGNOSIS — M179 Osteoarthritis of knee, unspecified: Secondary | ICD-10-CM

## 2014-01-08 DIAGNOSIS — IMO0002 Reserved for concepts with insufficient information to code with codable children: Secondary | ICD-10-CM

## 2014-01-08 LAB — POCT INR: INR: 2.6

## 2014-01-08 MED ORDER — HYDROCODONE-ACETAMINOPHEN 5-325 MG PO TABS: 1.0000 | ORAL_TABLET | Freq: Four times a day (QID) | ORAL | Status: DC | PRN

## 2014-01-08 MED ORDER — FLAVOCOXID-CIT ZN BISGLCINATE 500-50 MG PO CAPS: 1.0000 | ORAL_CAPSULE | Freq: Two times a day (BID) | ORAL | Status: DC

## 2014-01-08 NOTE — Progress Notes (Signed)
Pre-visit discussion using our clinic review tool. No additional management support is needed unless otherwise documented below in the visit note.  

## 2014-01-08 NOTE — Patient Instructions (Signed)
Osteoarthritis Osteoarthritis is a disease that causes soreness and swelling (inflammation) of a joint. It occurs when the cartilage at the affected joint wears down. Cartilage acts as a cushion, covering the ends of bones where they meet to form a joint. Osteoarthritis is the most common form of arthritis. It often occurs in older people. The joints affected most often by this condition include those in the:  Ends of the fingers.  Thumbs.  Neck.  Lower back.  Knees.  Hips. CAUSES  Over time, the cartilage that covers the ends of bones begins to wear away. This causes bone to rub on bone, producing pain and stiffness in the affected joints.  RISK FACTORS Certain factors can increase your chances of having osteoarthritis, including:  Older age.  Excessive body weight.  Overuse of joints. SIGNS AND SYMPTOMS   Pain, swelling, and stiffness in the joint.  Over time, the joint may lose its normal shape.  Small deposits of bone (osteophytes) may grow on the edges of the joint.  Bits of bone or cartilage can break off and float inside the joint space. This may cause more pain and damage. DIAGNOSIS  Your health care provider will do a physical exam and ask about your symptoms. Various tests may be ordered, such as:  X-rays of the affected joint.  An MRI scan.  Blood tests to rule out other types of arthritis.  Joint fluid tests. This involves using a needle to draw fluid from the joint and examining the fluid under a microscope. TREATMENT  Goals of treatment are to control pain and improve joint function. Treatment plans may include:  A prescribed exercise program that allows for rest and joint relief.  A weight control plan.  Pain relief techniques, such as:  Properly applied heat and cold.  Electric pulses delivered to nerve endings under the skin (transcutaneous electrical nerve stimulation, TENS).  Massage.  Certain nutritional supplements.  Medicines to  control pain, such as:  Acetaminophen.  Nonsteroidal anti-inflammatory drugs (NSAIDs), such as naproxen.  Narcotic or central-acting agents, such as tramadol.  Corticosteroids. These can be given orally or as an injection.  Surgery to reposition the bones and relieve pain (osteotomy) or to remove loose pieces of bone and cartilage. Joint replacement may be needed in advanced states of osteoarthritis. HOME CARE INSTRUCTIONS   Only take over-the-counter or prescription medicines as directed by your health care provider. Take all medicines exactly as instructed.  Maintain a healthy weight. Follow your health care provider's instructions for weight control. This may include dietary instructions.  Exercise as directed. Your health care provider can recommend specific types of exercise. These may include:  Strengthening exercises These are done to strengthen the muscles that support joints affected by arthritis. They can be performed with weights or with exercise bands to add resistance.  Aerobic activities These are exercises, such as brisk walking or low-impact aerobics, that get your heart pumping.  Range-of-motion activities These keep your joints limber.  Balance and agility exercises These help you maintain daily living skills.  Rest your affected joints as directed by your health care provider.  Follow up with your health care provider as directed. SEEK MEDICAL CARE IF:   Your skin turns red.  You develop a rash in addition to your joint pain.  You have worsening joint pain. SEEK IMMEDIATE MEDICAL CARE IF:  You have a significant loss of weight or appetite.  You have a fever along with joint or muscle aches.  You have   night sweats. FOR MORE INFORMATION  National Institute of Arthritis and Musculoskeletal and Skin Diseases: www.niams.nih.gov National Institute on Aging: www.nia.nih.gov American College of Rheumatology: www.rheumatology.org Document Released: 11/19/2005  Document Revised: 09/09/2013 Document Reviewed: 07/27/2013 [employer] Patient Information 2014 [employer].  

## 2014-01-10 ENCOUNTER — Encounter: Payer: Self-pay | Admitting: Internal Medicine

## 2014-01-10 NOTE — Assessment & Plan Note (Signed)
Podiatry referral at her request 

## 2014-01-10 NOTE — Assessment & Plan Note (Signed)
Her BP is well controlled 

## 2014-01-10 NOTE — Assessment & Plan Note (Addendum)
She will cont norco as needed for pain I have also asked her to add limbrel for additional pain relief

## 2014-01-10 NOTE — Progress Notes (Signed)
   Subjective:    Patient ID: Annette Mckay, female    DOB: December 23, 1926, 80 y.o.   MRN: [id number]  Arthritis Presents for follow-up visit. The disease course has been fluctuating. She complains of pain. She reports no stiffness, joint swelling or joint warmth. Affected locations include the right knee and left knee. Her pain is at a severity of 3/10. Associated symptoms include pain at night and pain while resting. Pertinent negatives include no diarrhea, dry eyes, dry mouth, dysuria, fatigue, fever, rash, Raynaud's syndrome, uveitis or weight loss. Her past medical history is significant for osteoarthritis. There is no history of rheumatoid arthritis.  Compliance with total regimen is 76-100%.      Review of Systems  Constitutional: Negative for fever, weight loss and fatigue.  Gastrointestinal: Negative for diarrhea.  Genitourinary: Negative for dysuria.  Musculoskeletal: Positive for arthritis. Negative for joint swelling and stiffness.  Skin: Negative for rash.  All other systems reviewed and are negative.       Objective:   Physical Exam  Vitals reviewed. Constitutional: She is oriented to person, place, and time. She appears well-developed and well-nourished.  HENT:  Head: Normocephalic and atraumatic.  Mouth/Throat: Oropharynx is clear and moist. No oropharyngeal exudate.  Eyes: Conjunctivae are normal. Right eye exhibits no discharge. Left eye exhibits no discharge. No scleral icterus.  Neck: Normal range of motion. Neck supple. No JVD present. No tracheal deviation present. No thyromegaly present.  Cardiovascular: Normal rate, regular rhythm, normal heart sounds and intact distal pulses.  Exam reveals no gallop and no friction rub.   No murmur heard. Pulmonary/Chest: Effort normal and breath sounds normal. No stridor. No respiratory distress. She has no wheezes. She has no rales. She exhibits no tenderness.  Abdominal: Soft. Bowel sounds are normal. She exhibits no  distension and no mass. There is no tenderness. There is no rebound and no guarding.  Musculoskeletal: Normal range of motion. She exhibits edema (trace pitting edema in BLE). She exhibits no tenderness.  Lymphadenopathy:    She has no cervical adenopathy.  Neurological: She is oriented to person, place, and time.  Skin: Skin is warm and dry. No rash noted. She is not diaphoretic. No erythema. No pallor.  Both great toenails are very long with thickening and subungual debris  Psychiatric: She has a normal mood and affect. Her behavior is normal. Judgment and thought content normal.     Lab Results  Component Value Date   WBC 2.9* 07/29/2013   HGB 11.7* 07/29/2013   HCT 33.7* 07/29/2013   PLT 153.0 07/29/2013   GLUCOSE 92 07/29/2013   CHOL 140 07/29/2013   TRIG 57.0 07/29/2013   HDL 60.80 07/29/2013   LDLCALC 68 07/29/2013   ALT 23 07/29/2013   AST 39* 07/29/2013   NA 139 07/29/2013   K 4.5 07/29/2013   CL 104 07/29/2013   CREATININE 1.4* 07/29/2013   BUN 25* 07/29/2013   CO2 27 07/29/2013   TSH 0.71 07/29/2013   INR 2.6 01/08/2014   HGBA1C 5.3 03/12/2012       Assessment & Plan:

## 2014-02-12 ENCOUNTER — Ambulatory Visit (INDEPENDENT_AMBULATORY_CARE_PROVIDER_SITE_OTHER): Payer: Medicare Other | Admitting: General Practice

## 2014-02-12 DIAGNOSIS — I82409 Acute embolism and thrombosis of unspecified deep veins of unspecified lower extremity: Secondary | ICD-10-CM

## 2014-02-12 DIAGNOSIS — Z7901 Long term (current) use of anticoagulants: Secondary | ICD-10-CM

## 2014-02-12 DIAGNOSIS — Z5181 Encounter for therapeutic drug level monitoring: Secondary | ICD-10-CM

## 2014-02-12 LAB — POCT INR: INR: 3.1

## 2014-02-12 NOTE — Progress Notes (Signed)
Pre visit review using our clinic review tool, if applicable. No additional management support is needed unless otherwise documented below in the visit note. 

## 2014-03-12 ENCOUNTER — Ambulatory Visit (INDEPENDENT_AMBULATORY_CARE_PROVIDER_SITE_OTHER): Payer: Medicare Other | Admitting: General Practice

## 2014-03-12 DIAGNOSIS — Z7901 Long term (current) use of anticoagulants: Secondary | ICD-10-CM

## 2014-03-12 DIAGNOSIS — I82409 Acute embolism and thrombosis of unspecified deep veins of unspecified lower extremity: Secondary | ICD-10-CM

## 2014-03-12 DIAGNOSIS — Z5181 Encounter for therapeutic drug level monitoring: Secondary | ICD-10-CM

## 2014-03-12 LAB — POCT INR: INR: 4.1

## 2014-03-12 NOTE — Progress Notes (Signed)
Pre visit review using our clinic review tool, if applicable. No additional management support is needed unless otherwise documented below in the visit note. 

## 2014-03-17 ENCOUNTER — Other Ambulatory Visit: Payer: Self-pay

## 2014-03-17 DIAGNOSIS — I10 Essential (primary) hypertension: Secondary | ICD-10-CM

## 2014-03-17 MED ORDER — CLONIDINE HCL 0.2 MG PO TABS: 0.2000 mg | ORAL_TABLET | Freq: Two times a day (BID) | ORAL | Status: DC

## 2014-03-26 ENCOUNTER — Ambulatory Visit (INDEPENDENT_AMBULATORY_CARE_PROVIDER_SITE_OTHER): Payer: Medicare Other | Admitting: General Practice

## 2014-03-26 DIAGNOSIS — Z7901 Long term (current) use of anticoagulants: Secondary | ICD-10-CM

## 2014-03-26 DIAGNOSIS — I82409 Acute embolism and thrombosis of unspecified deep veins of unspecified lower extremity: Secondary | ICD-10-CM

## 2014-03-26 DIAGNOSIS — Z5181 Encounter for therapeutic drug level monitoring: Secondary | ICD-10-CM

## 2014-03-26 LAB — POCT INR: INR: 2.3

## 2014-03-26 NOTE — Progress Notes (Signed)
Pre visit review using our clinic review tool, if applicable. No additional management support is needed unless otherwise documented below in the visit note. 

## 2014-04-15 ENCOUNTER — Other Ambulatory Visit: Payer: Self-pay | Admitting: Internal Medicine

## 2014-04-23 ENCOUNTER — Other Ambulatory Visit: Payer: Self-pay | Admitting: General Practice

## 2014-04-23 ENCOUNTER — Ambulatory Visit (INDEPENDENT_AMBULATORY_CARE_PROVIDER_SITE_OTHER): Payer: Medicare Other | Admitting: General Practice

## 2014-04-23 DIAGNOSIS — M1710 Unilateral primary osteoarthritis, unspecified knee: Secondary | ICD-10-CM

## 2014-04-23 DIAGNOSIS — I82409 Acute embolism and thrombosis of unspecified deep veins of unspecified lower extremity: Secondary | ICD-10-CM

## 2014-04-23 DIAGNOSIS — M179 Osteoarthritis of knee, unspecified: Secondary | ICD-10-CM

## 2014-04-23 DIAGNOSIS — Z5181 Encounter for therapeutic drug level monitoring: Secondary | ICD-10-CM

## 2014-04-23 LAB — POCT INR: INR: 2.8

## 2014-04-23 MED ORDER — FLAVOCOXID-CIT ZN BISGLCINATE 500-50 MG PO CAPS: 1.0000 | ORAL_CAPSULE | Freq: Two times a day (BID) | ORAL | Status: DC

## 2014-04-23 NOTE — Progress Notes (Signed)
Pre visit review using our clinic review tool, if applicable. No additional management support is needed unless otherwise documented below in the visit note. 

## 2014-05-07 ENCOUNTER — Telehealth: Payer: Self-pay

## 2014-05-07 ENCOUNTER — Other Ambulatory Visit: Payer: Self-pay | Admitting: General Practice

## 2014-05-07 MED ORDER — WARFARIN SODIUM 5 MG PO TABS: ORAL_TABLET | ORAL | Status: DC

## 2014-05-07 NOTE — Telephone Encounter (Signed)
Warfarin refill request.

## 2014-05-11 ENCOUNTER — Other Ambulatory Visit: Payer: Self-pay

## 2014-05-11 MED ORDER — LOSARTAN POTASSIUM-HCTZ 100-25 MG PO TABS: ORAL_TABLET | ORAL | Status: DC

## 2014-05-12 ENCOUNTER — Ambulatory Visit: Payer: Medicare Other | Admitting: Internal Medicine

## 2014-05-12 DIAGNOSIS — Z0289 Encounter for other administrative examinations: Secondary | ICD-10-CM

## 2014-05-25 ENCOUNTER — Ambulatory Visit (INDEPENDENT_AMBULATORY_CARE_PROVIDER_SITE_OTHER): Payer: Medicare Other | Admitting: Family Medicine

## 2014-05-25 DIAGNOSIS — Z5181 Encounter for therapeutic drug level monitoring: Secondary | ICD-10-CM

## 2014-05-25 DIAGNOSIS — I82409 Acute embolism and thrombosis of unspecified deep veins of unspecified lower extremity: Secondary | ICD-10-CM

## 2014-05-25 LAB — POCT INR: INR: 2.7

## 2014-06-07 ENCOUNTER — Ambulatory Visit: Payer: Medicare Other | Admitting: Internal Medicine

## 2014-06-09 ENCOUNTER — Ambulatory Visit: Payer: Medicare Other | Admitting: Internal Medicine

## 2014-06-15 ENCOUNTER — Telehealth: Payer: Self-pay | Admitting: Internal Medicine

## 2014-06-15 DIAGNOSIS — M545 Low back pain, unspecified: Secondary | ICD-10-CM

## 2014-06-15 DIAGNOSIS — M179 Osteoarthritis of knee, unspecified: Secondary | ICD-10-CM

## 2014-06-15 MED ORDER — HYDROCODONE-ACETAMINOPHEN 5-325 MG PO TABS: 1.0000 | ORAL_TABLET | Freq: Four times a day (QID) | ORAL | Status: DC | PRN

## 2014-06-15 NOTE — Telephone Encounter (Signed)
Patient has appt 7.30 with Dr. Yetta BarreJones.  She has five more tablets of hydrocodone.  She would like to know if she could get a script for that med before appt date.

## 2014-06-15 NOTE — Telephone Encounter (Signed)
done

## 2014-06-16 NOTE — Telephone Encounter (Signed)
Pt notified//lmovm, rx upfront

## 2014-07-01 ENCOUNTER — Other Ambulatory Visit (INDEPENDENT_AMBULATORY_CARE_PROVIDER_SITE_OTHER): Payer: Medicare Other

## 2014-07-01 ENCOUNTER — Encounter (HOSPITAL_COMMUNITY): Payer: Self-pay | Admitting: Emergency Medicine

## 2014-07-01 ENCOUNTER — Emergency Department (HOSPITAL_COMMUNITY): Payer: Medicare Other

## 2014-07-01 ENCOUNTER — Emergency Department (HOSPITAL_COMMUNITY)
Admission: EM | Admit: 2014-07-01 | Discharge: 2014-07-01 | Disposition: A | Discharge status: Home / Self Care | Payer: Medicare Other | Attending: Emergency Medicine | Admitting: Emergency Medicine

## 2014-07-01 ENCOUNTER — Encounter: Payer: Self-pay | Admitting: Internal Medicine

## 2014-07-01 ENCOUNTER — Ambulatory Visit (INDEPENDENT_AMBULATORY_CARE_PROVIDER_SITE_OTHER): Payer: Medicare Other | Admitting: Internal Medicine

## 2014-07-01 VITALS — BP 164/70 | HR 56 | Temp 98.1°F | Resp 16 | Ht 63.0 in | Wt 186.4 lb

## 2014-07-01 DIAGNOSIS — I10 Essential (primary) hypertension: Secondary | ICD-10-CM

## 2014-07-01 DIAGNOSIS — Z7901 Long term (current) use of anticoagulants: Secondary | ICD-10-CM | POA: Diagnosis not present

## 2014-07-01 DIAGNOSIS — S59909A Unspecified injury of unspecified elbow, initial encounter: Secondary | ICD-10-CM

## 2014-07-01 DIAGNOSIS — Z87448 Personal history of other diseases of urinary system: Secondary | ICD-10-CM | POA: Insufficient documentation

## 2014-07-01 DIAGNOSIS — IMO0002 Reserved for concepts with insufficient information to code with codable children: Secondary | ICD-10-CM | POA: Diagnosis not present

## 2014-07-01 DIAGNOSIS — D61818 Other pancytopenia: Secondary | ICD-10-CM

## 2014-07-01 DIAGNOSIS — F329 Major depressive disorder, single episode, unspecified: Secondary | ICD-10-CM | POA: Insufficient documentation

## 2014-07-01 DIAGNOSIS — M545 Low back pain, unspecified: Secondary | ICD-10-CM

## 2014-07-01 DIAGNOSIS — Y9289 Other specified places as the place of occurrence of the external cause: Secondary | ICD-10-CM | POA: Diagnosis not present

## 2014-07-01 DIAGNOSIS — F3289 Other specified depressive episodes: Secondary | ICD-10-CM | POA: Diagnosis not present

## 2014-07-01 DIAGNOSIS — Z8739 Personal history of other diseases of the musculoskeletal system and connective tissue: Secondary | ICD-10-CM | POA: Diagnosis not present

## 2014-07-01 DIAGNOSIS — Z86718 Personal history of other venous thrombosis and embolism: Secondary | ICD-10-CM | POA: Insufficient documentation

## 2014-07-01 DIAGNOSIS — S59901A Unspecified injury of right elbow, initial encounter: Secondary | ICD-10-CM

## 2014-07-01 DIAGNOSIS — Z862 Personal history of diseases of the blood and blood-forming organs and certain disorders involving the immune mechanism: Secondary | ICD-10-CM | POA: Diagnosis not present

## 2014-07-01 DIAGNOSIS — Z79899 Other long term (current) drug therapy: Secondary | ICD-10-CM | POA: Insufficient documentation

## 2014-07-01 DIAGNOSIS — W010XXA Fall on same level from slipping, tripping and stumbling without subsequent striking against object, initial encounter: Secondary | ICD-10-CM | POA: Insufficient documentation

## 2014-07-01 DIAGNOSIS — S6990XA Unspecified injury of unspecified wrist, hand and finger(s), initial encounter: Secondary | ICD-10-CM

## 2014-07-01 DIAGNOSIS — S0990XA Unspecified injury of head, initial encounter: Secondary | ICD-10-CM | POA: Insufficient documentation

## 2014-07-01 DIAGNOSIS — Z872 Personal history of diseases of the skin and subcutaneous tissue: Secondary | ICD-10-CM | POA: Diagnosis not present

## 2014-07-01 DIAGNOSIS — N183 Chronic kidney disease, stage 3 unspecified: Secondary | ICD-10-CM

## 2014-07-01 DIAGNOSIS — K219 Gastro-esophageal reflux disease without esophagitis: Secondary | ICD-10-CM | POA: Diagnosis not present

## 2014-07-01 DIAGNOSIS — E669 Obesity, unspecified: Secondary | ICD-10-CM | POA: Diagnosis not present

## 2014-07-01 DIAGNOSIS — Y9301 Activity, walking, marching and hiking: Secondary | ICD-10-CM | POA: Diagnosis not present

## 2014-07-01 DIAGNOSIS — M25521 Pain in right elbow: Secondary | ICD-10-CM

## 2014-07-01 DIAGNOSIS — S59919A Unspecified injury of unspecified forearm, initial encounter: Secondary | ICD-10-CM

## 2014-07-01 DIAGNOSIS — M1710 Unilateral primary osteoarthritis, unspecified knee: Secondary | ICD-10-CM

## 2014-07-01 DIAGNOSIS — W1809XA Striking against other object with subsequent fall, initial encounter: Secondary | ICD-10-CM | POA: Diagnosis not present

## 2014-07-01 DIAGNOSIS — E7800 Pure hypercholesterolemia, unspecified: Secondary | ICD-10-CM | POA: Insufficient documentation

## 2014-07-01 DIAGNOSIS — W19XXXA Unspecified fall, initial encounter: Secondary | ICD-10-CM

## 2014-07-01 DIAGNOSIS — M170 Bilateral primary osteoarthritis of knee: Secondary | ICD-10-CM

## 2014-07-01 LAB — CBC WITH DIFFERENTIAL/PLATELET
BASOS ABS: 0 10*3/uL (ref 0.0–0.1)
BASOS PCT: 0.5 % (ref 0.0–3.0)
EOS ABS: 0.1 10*3/uL (ref 0.0–0.7)
Eosinophils Relative: 2.7 % (ref 0.0–5.0)
HCT: 35.6 % — ABNORMAL LOW (ref 36.0–46.0)
HEMOGLOBIN: 12.1 g/dL (ref 12.0–15.0)
Lymphocytes Relative: 32.2 % (ref 12.0–46.0)
Lymphs Abs: 0.9 10*3/uL (ref 0.7–4.0)
MCHC: 34.1 g/dL (ref 30.0–36.0)
MCV: 99.5 fl (ref 78.0–100.0)
MONOS PCT: 7.8 % (ref 3.0–12.0)
Monocytes Absolute: 0.2 10*3/uL (ref 0.1–1.0)
NEUTROS ABS: 1.7 10*3/uL (ref 1.4–7.7)
NEUTROS PCT: 56.8 % (ref 43.0–77.0)
Platelets: 162 10*3/uL (ref 150.0–400.0)
RBC: 3.58 Mil/uL — AB (ref 3.87–5.11)
RDW: 13.3 % (ref 11.5–15.5)
WBC: 2.9 10*3/uL — ABNORMAL LOW (ref 4.0–10.5)

## 2014-07-01 LAB — BASIC METABOLIC PANEL
BUN: 35 mg/dL — ABNORMAL HIGH (ref 6–23)
CO2: 29 mEq/L (ref 19–32)
Calcium: 10.9 mg/dL — ABNORMAL HIGH (ref 8.4–10.5)
Chloride: 100 mEq/L (ref 96–112)
Creatinine, Ser: 1.4 mg/dL — ABNORMAL HIGH (ref 0.4–1.2)
GFR: 44.45 mL/min — ABNORMAL LOW (ref >60.00)
Glucose, Bld: 83 mg/dL (ref 70–99)
Potassium: 4.4 mEq/L (ref 3.5–5.1)
Sodium: 135 mEq/L (ref 135–145)

## 2014-07-01 MED ORDER — HYDROCODONE-ACETAMINOPHEN 5-325 MG PO TABS
1.0000 | ORAL_TABLET | Freq: Once | ORAL | Status: AC
  Administered 2014-07-01: 1 via ORAL
  Filled 2014-07-01: qty 1

## 2014-07-01 MED ORDER — HYDROCODONE-ACETAMINOPHEN 10-325 MG PO TABS: 1.0000 | ORAL_TABLET | Freq: Three times a day (TID) | ORAL | Status: DC | PRN

## 2014-07-01 MED ORDER — HYDROCODONE-ACETAMINOPHEN 5-325 MG PO TABS: 1.0000 | ORAL_TABLET | Freq: Three times a day (TID) | ORAL | Status: DC | PRN

## 2014-07-01 MED ORDER — ACETAMINOPHEN 325 MG PO TABS
650.0000 mg | ORAL_TABLET | Freq: Once | ORAL | Status: AC
  Administered 2014-07-01: 650 mg via ORAL
  Filled 2014-07-01: qty 2

## 2014-07-01 NOTE — ED Notes (Signed)
Pt in via EMS from her MD office, pt fell outside while walking down a hill, states she tripped, denies LOC, denies headache, c/o pain to right occipital area when impact occurred, abrasion to right elbow also. Pt alert and oriented, takes blood thinners. No distress noted.

## 2014-07-01 NOTE — ED Notes (Signed)
The patient did take her BP medication this morning and I advised the PA of same. The patient's blood pressure is 223/76, I advised the PA and no orders were given.

## 2014-07-01 NOTE — ED Notes (Signed)
Patient transported to X-ray 

## 2014-07-01 NOTE — Progress Notes (Signed)
Pre visit review using our clinic review tool, if applicable. No additional management support is needed unless otherwise documented below in the visit note. 

## 2014-07-01 NOTE — ED Notes (Signed)
Patient is alert and orientedx4.  Patient was explained discharge instructions and they understood them with no questions.  Ms. Annette Mckay, her friend is taking her home.

## 2014-07-01 NOTE — Discharge Instructions (Signed)
Please hold your Coumadin dose tonight, and call your primary care doctor in the morning to discuss restarting it. If you develop any concerning signs or symptoms of a head injury listed below please return immediately to the ER. Please reserve use of Norco for severe pain. Please do not drive on this medication as it may make you drowsy or impaired. Please use Tylenol for mild pain. Do not take Tylenol and Norco together as Norco already contains Tylenol. Please read all discharge instructions and return precautions.   Head Injury You have received a head injury. It does not appear serious at this time. Headaches and vomiting are common following head injury. It should be easy to awaken from sleeping. Sometimes it is necessary for you to stay in the emergency department for a while for observation. Sometimes admission to the hospital may be needed. After injuries such as yours, most problems occur within the first 24 hours, but side effects may occur up to 7-10 days after the injury. It is important for you to carefully monitor your condition and contact your health care provider or seek immediate medical care if there is a change in your condition. WHAT ARE THE TYPES OF HEAD INJURIES? Head injuries can be as minor as a bump. Some head injuries can be more severe. More severe head injuries include:  A jarring injury to the brain (concussion).  A bruise of the brain (contusion). This mean there is bleeding in the brain that can cause swelling.  A cracked skull (skull fracture).  Bleeding in the brain that collects, clots, and forms a bump (hematoma). WHAT CAUSES A HEAD INJURY? A serious head injury is most likely to happen to someone who is in a car wreck and is not wearing a seat belt. Other causes of major head injuries include bicycle or motorcycle accidents, sports injuries, and falls. HOW ARE HEAD INJURIES DIAGNOSED? A complete history of the event leading to the injury and your current  symptoms will be helpful in diagnosing head injuries. Many times, pictures of the brain, such as CT or MRI are needed to see the extent of the injury. Often, an overnight hospital stay is necessary for observation.  WHEN SHOULD I SEEK IMMEDIATE MEDICAL CARE?  You should get help right away if:  You have confusion or drowsiness.  You feel sick to your stomach (nauseous) or have continued, forceful vomiting.  You have dizziness or unsteadiness that is getting worse.  You have severe, continued headaches not relieved by medicine. Only take over-the-counter or prescription medicines for pain, fever, or discomfort as directed by your health care provider.  You do not have normal function of the arms or legs or are unable to walk.  You notice changes in the black spots in the center of the colored part of your eye (pupil).  You have a clear or bloody fluid coming from your nose or ears.  You have a loss of vision. During the next 24 hours after the injury, you must stay with someone who can watch you for the warning signs. This person should contact local emergency services (911 in the [place] if you have seizures, you become unconscious, or you are unable to wake up. HOW CAN I PREVENT A HEAD INJURY IN THE FUTURE? The most important factor for preventing major head injuries is avoiding motor vehicle accidents. To minimize the potential for damage to your head, it is crucial to wear seat belts while riding in motor vehicles. Wearing helmets while bike  riding and playing collision sports (like football) is also helpful. Also, avoiding dangerous activities around the house will further help reduce your risk of head injury.  WHEN CAN I RETURN TO NORMAL ACTIVITIES AND ATHLETICS? You should be reevaluated by your health care provider before returning to these activities. If you have any of the following symptoms, you should not return to activities or contact sports until 1 week after the symptoms have  stopped:  Persistent headache.  Dizziness or vertigo.  Poor attention and concentration.  Confusion.  Memory problems.  Nausea or vomiting.  Fatigue or tire easily.  Irritability.  Intolerant of bright lights or loud noises.  Anxiety or depression.  Disturbed sleep. MAKE SURE YOU:   Understand these instructions.  Will watch your condition.  Will get help right away if you are not doing well or get worse. Document Released: 11/19/2005 Document Revised: 11/24/2013 Document Reviewed: 07/27/2013 [facility] Patient Information 2015 [place], [place]. This information is not intended to replace advice given to you by your health care provider. Make sure you discuss any questions you have with your health care provider.

## 2014-07-01 NOTE — ED Provider Notes (Signed)
CSN: [id number]     Arrival date & time 07/01/14  1746 History   First MD Initiated Contact with Patient 07/01/14 1746     Chief Complaint  Patient presents with  . Head Injury     (Consider location/radiation/quality/duration/timing/severity/associated sxs/prior Treatment) HPI Comments: Patient is an 80 yo F PMHx significant for anemia, GERD, depression, HLD presenting to the emergency department after slipping and hitting her head. Patient states she was leaving her PCP office, walking down an incline when she lost her footing causing her to land on her right elbow and head. She denies any loss of consciousness or emesis. She endorses a posterior headache and a "goose egg." She is also complaining of mild right elbow pain with abrasion. Denies any precipitating chest pain, headache, shortness of breath, lightheadedness or dizziness. Tetanus UTD.   Patient is a 80 y.o. female presenting with head injury.  Head Injury Associated symptoms: headache   Associated symptoms: no nausea, no numbness and no vomiting     Past Medical History  Diagnosis Date  . ACUTE VENOUS EMBO & THROMB SUP VEINS UPPER EXT 08/01/2010  . ANEMIA-NOS 11/09/2010  . ARTHRALGIA 08/01/2010  . BACK PAIN, LUMBAR 08/01/2010  . CONSTIPATION 08/01/2010  . DEPRESSION 08/01/2010  . DVT 08/08/2010  . ECZEMA, ATOPIC 08/01/2010  . Edema 08/08/2010  . ESOPHAGEAL STRICTURE 08/01/2010  . GERD 08/01/2010  . HYPERCHOLESTEROLEMIA 08/01/2010  . HYPERGLYCEMIA 08/01/2010  . Obesity, unspecified 08/01/2010  . RENAL DISEASE 08/01/2010  . Senile dementia, uncomplicated 11/09/2010   Past Surgical History  Procedure Laterality Date  . Blood clots-removed  1985  . Tumor removed  1980   Family History  Problem Relation Age of Onset  . Diabetes Brother   . Cancer Brother     colon   History  Substance Use Topics  . Smoking status: Never Smoker   . Smokeless tobacco: Never Used  . Alcohol Use: 3.0 oz/week    5 Shots of liquor per week   OB  History   Grav Para Term Preterm Abortions TAB SAB Ect Mult Living                 Review of Systems  Constitutional: Negative for fever and chills.  Respiratory: Negative for shortness of breath.   Cardiovascular: Negative for chest pain.  Gastrointestinal: Negative for nausea, vomiting and abdominal pain.  Skin: Positive for wound (abrasion).  Neurological: Positive for headaches. Negative for dizziness, syncope, weakness, light-headedness and numbness.  All other systems reviewed and are negative.     Allergies  Amlodipine and Tramadol  Home Medications   Prior to Admission medications   Medication Sig Start Date End Date Taking? Authorizing Provider  Calcium Carb-Cholecalciferol (CALCIUM 500 +D) 500-400 MG-UNIT TABS Take 1 tablet by mouth 3 (three) times daily.    Yes Historical Provider, MD  citalopram (CELEXA) 10 MG tablet Take 10 mg by mouth daily.   Yes Historical Provider, MD  cloNIDine (CATAPRES) 0.2 MG tablet Take 1 tablet (0.2 mg total) by mouth 2 (two) times daily. 03/17/14  Yes Etta Grandchild, MD  HYDROcodone-acetaminophen (NORCO) 10-325 MG per tablet Take 1 tablet by mouth every 8 (eight) hours as needed. 07/01/14  Yes Etta Grandchild, MD  losartan-hydrochlorothiazide (HYZAAR) 100-25 MG per tablet Take 1 tablet by mouth daily.   Yes Historical Provider, MD  nebivolol (BYSTOLIC) 5 MG tablet Take 1 tablet (5 mg total) by mouth daily. 07/29/13  Yes Etta Grandchild, MD  omeprazole (PRILOSEC) 20  MG capsule Take 20 mg by mouth daily.   Yes Historical Provider, MD  pravastatin (PRAVACHOL) 40 MG tablet Take 40 mg by mouth daily.   Yes Historical Provider, MD  warfarin (COUMADIN) 5 MG tablet Take 5 mg by mouth See admin instructions. Take 1/2 tablet on Wednesday. Take one tablet on all other days.   Yes Historical Provider, MD  HYDROcodone-acetaminophen (NORCO/VICODIN) 5-325 MG per tablet Take 1 tablet by mouth every 8 (eight) hours as needed for severe pain. 07/01/14   Jeron Grahn L  Concepcion Gillott, PA-C   BP 223/76  Pulse 58  Temp(Src) 98.5 F (36.9 C) (Oral)  Resp 18  SpO2 99% Physical Exam  Nursing note and vitals reviewed. Constitutional: She is oriented to person, place, and time. She appears well-developed and well-nourished. No distress.  HENT:  Head: Normocephalic and atraumatic. Head is without raccoon's eyes and without laceration.    Right Ear: External ear normal.  Left Ear: External ear normal.  Nose: Nose normal.  Mouth/Throat: Oropharynx is clear and moist. No oropharyngeal exudate.  Eyes: Conjunctivae and EOM are normal. Pupils are equal, round, and reactive to light.  Neck: Normal range of motion and full passive range of motion without pain. Neck supple. No spinous process tenderness and no muscular tenderness present.  Cardiovascular: Normal rate, regular rhythm, normal heart sounds and intact distal pulses.   Pulmonary/Chest: Effort normal and breath sounds normal. No respiratory distress.  Abdominal: Soft. There is no tenderness.  Neurological: She is alert and oriented to person, place, and time. She has normal strength. No cranial nerve deficit. Gait normal. GCS eye subscore is 4. GCS verbal subscore is 5. GCS motor subscore is 6.  Sensation grossly intact.  No pronator drift.  Bilateral heel-knee-shin intact.  Skin: Skin is warm and dry. She is not diaphoretic.  Small skin abrasion over right posterior elbow. Bleeding controlled.     ED Course  Procedures (including critical care time) Medications  acetaminophen (TYLENOL) tablet 650 mg (650 mg Oral Given 07/01/14 1809)  HYDROcodone-acetaminophen (NORCO/VICODIN) 5-325 MG per tablet 1 tablet (1 tablet Oral Given 07/01/14 1947)    Labs Review Labs Reviewed - No data to display  Imaging Review Dg Elbow Complete Right  07/01/2014   CLINICAL DATA:  Right elbow pain secondary to a fall.  EXAM: RIGHT ELBOW - COMPLETE 3+ VIEW  COMPARISON:  None.  FINDINGS: There is no evidence of fracture,  dislocation, or joint effusion. There is no evidence of arthropathy or other focal bone abnormality. Soft tissues are unremarkable.  IMPRESSION: Normal exam.   Electronically Signed   By: Geanie Cooley M.D.   On: 07/01/2014 19:32   Ct Head Wo Contrast  07/01/2014   CLINICAL DATA:  Pain post trauma  EXAM: CT HEAD WITHOUT CONTRAST  TECHNIQUE: Contiguous axial images were obtained from the base of the skull through the vertex without intravenous contrast.  COMPARISON:  None.  FINDINGS: There is mild diffuse atrophy. There is no mass, hemorrhage, extra-axial fluid collection, or midline shift. There is mild small vessel disease in the centra semiovale bilaterally. There is no demonstrable acute infarct. Bony calvarium appears intact. Visualized mastoid air cells are clear. There is a right parietal scalp hematoma.  IMPRESSION: Atrophy with mild small vessel disease. No intracranial hemorrhage, mass, or extra-axial fluid. There is a right parietal scalp hematoma.   Electronically Signed   By: Bretta Bang M.D.   On: 07/01/2014 19:11     EKG Interpretation None  MDM   Final diagnoses:  Head injury without skull fracture, initial encounter  Right elbow pain  Fall, initial encounter    Filed Vitals:   07/01/14 1938  BP: 223/76  Pulse: 58  Temp:   Resp: 18    Afebrile, NAD, non-toxic appearing, AAOx4.   Patient with mechanical fall. Posterior scalp hematoma noted. Small skin abrasion to right elbow. Tetanus UTD. Wound covered.   GCS 15, A&Ox4, no bleeding from the head, battle signs, or clear discharge resembling CSF fluid.  No focal neurological deficits on physical exam. CT image negative. Right elbow x-ray negative. Pt is hemodynamically stable. Pain managed in the ED. At this time there does not appear to be any evidence of an acute emergency medical condition and the patient appears stable for discharge with appropriate outpatient follow up. Discussed returning to the ED upon  presentation of any concerning symptoms and the dangers and symptoms. Advised follow up with PCP in 1-2 days. Pt verbalized understanding and is agreeable to discharge. Pt case discussed with Dr. Lynelle DoctorKnapp who agrees with my plan.      Jeannetta EllisJennifer L Emylia Latella, PA-C 07/01/14 2003

## 2014-07-01 NOTE — ED Provider Notes (Signed)
Medical screening examination/treatment/procedure(s) were conducted as a shared visit with non-physician practitioner(s) and myself.  I personally evaluated the patient during the encounter.  Mechanical fall.  Pt in no distress.  Scans negative.  Dc home with family.  Linwood DibblesJon Marshell Dilauro, MD 07/01/14 2014

## 2014-07-01 NOTE — Patient Instructions (Signed)

## 2014-07-01 NOTE — Progress Notes (Signed)
Subjective:    Patient ID: Annette Mckay, female    DOB: Feb 14, 1927, 80 y.o.   MRN: [id number]  Hypertension This is a chronic problem. The current episode started more than 1 year ago. The problem is unchanged. The problem is controlled. Associated symptoms include peripheral edema. Pertinent negatives include no anxiety, blurred vision, chest pain, malaise/fatigue, neck pain, orthopnea, palpitations, PND, shortness of breath or sweats. Past treatments include beta blockers, angiotensin blockers, diuretics and central alpha agonists. Compliance problems include diet and exercise.  Hypertensive end-organ damage includes kidney disease. Identifiable causes of hypertension include chronic renal disease.      Review of Systems  Constitutional: Negative.  Negative for fever, chills, malaise/fatigue, diaphoresis, appetite change and fatigue.  HENT: Negative.  Negative for sore throat and trouble swallowing.   Eyes: Negative.  Negative for blurred vision.  Respiratory: Negative.  Negative for cough, choking, chest tightness, shortness of breath, wheezing and stridor.   Cardiovascular: Negative.  Negative for chest pain, palpitations, orthopnea, leg swelling and PND.  Gastrointestinal: Negative.  Negative for nausea, vomiting, abdominal pain, diarrhea, constipation and blood in stool.  Endocrine: Negative.   Genitourinary: Negative.   Musculoskeletal: Positive for arthralgias and back pain. Negative for gait problem, joint swelling, myalgias, neck pain and neck stiffness.  Skin: Negative.  Negative for rash.  Allergic/Immunologic: Negative.   Neurological: Negative.   Hematological: Negative.  Negative for adenopathy. Does not bruise/bleed easily.  Psychiatric/Behavioral: Positive for hallucinations, confusion, sleep disturbance and decreased concentration. Negative for suicidal ideas, behavioral problems, self-injury, dysphoric mood and agitation. The patient is not nervous/anxious and is not  hyperactive.        Objective:   Physical Exam  Vitals reviewed. Constitutional: She is oriented to person, place, and time. She appears well-developed and well-nourished. No distress.  HENT:  Head: Normocephalic and atraumatic.  Mouth/Throat: Oropharynx is clear and moist. No oropharyngeal exudate.  Eyes: Conjunctivae are normal. Right eye exhibits no discharge. Left eye exhibits no discharge. No scleral icterus.  Neck: Normal range of motion. Neck supple. No JVD present. No tracheal deviation present. No thyromegaly present.  Cardiovascular: Normal rate, regular rhythm, normal heart sounds and intact distal pulses.  Exam reveals no gallop and no friction rub.   No murmur heard. Pulmonary/Chest: Effort normal and breath sounds normal. No stridor. No respiratory distress. She has no wheezes. She has no rales. She exhibits no tenderness.  Abdominal: Soft. Bowel sounds are normal. She exhibits no distension and no mass. There is no tenderness. There is no rebound and no guarding.  Musculoskeletal: Normal range of motion. She exhibits edema (1+ pitting edema in BLE). She exhibits no tenderness.  Lymphadenopathy:    She has no cervical adenopathy.  Neurological: She is oriented to person, place, and time.  Skin: Skin is warm and dry. No rash noted. She is not diaphoretic. No erythema. No pallor.  Psychiatric: She has a normal mood and affect. Judgment and thought content normal. Her mood appears not anxious. Her affect is not angry, not blunt, not labile and not inappropriate. Her speech is delayed and tangential. She is slowed and withdrawn. She is not agitated, not aggressive, not hyperactive, not actively hallucinating and not combative. Cognition and memory are impaired. She does not exhibit a depressed mood. She exhibits abnormal recent memory and abnormal remote memory. She is inattentive.     Lab Results  Component Value Date   WBC 2.9* 07/29/2013   HGB 11.7* 07/29/2013   HCT 33.7*  07/29/2013   PLT 153.0 07/29/2013   GLUCOSE 92 07/29/2013   CHOL 140 07/29/2013   TRIG 57.0 07/29/2013   HDL 60.80 07/29/2013   LDLCALC 68 07/29/2013   ALT 23 07/29/2013   AST 39* 07/29/2013   NA 139 07/29/2013   K 4.5 07/29/2013   CL 104 07/29/2013   CREATININE 1.4* 07/29/2013   BUN 25* 07/29/2013   CO2 27 07/29/2013   TSH 0.71 07/29/2013   INR 2.7 05/25/2014   HGBA1C 5.3 03/12/2012       Assessment & Plan:

## 2014-07-02 ENCOUNTER — Telehealth: Payer: Self-pay | Admitting: Oncology

## 2014-07-02 ENCOUNTER — Telehealth: Payer: Self-pay | Admitting: Internal Medicine

## 2014-07-02 NOTE — Assessment & Plan Note (Signed)
I will recheck her CBC and refer back to hematology for a recheck

## 2014-07-02 NOTE — Assessment & Plan Note (Signed)
After I saw her she fell in the foyer Will send to the ER for further evaluation

## 2014-07-02 NOTE — Assessment & Plan Note (Signed)
She will cont norco as needed for pain 

## 2014-07-02 NOTE — Assessment & Plan Note (Signed)
I will monitor her renal function today 

## 2014-07-02 NOTE — Telephone Encounter (Signed)
S/W PATIENT AND GAVE NP APPT FOR 08/12 @ 3 W/DR. LIVESAY, CEHMO EDU 08/06 @ 10. PATIENT CONFIRM ALL APPTS.

## 2014-07-02 NOTE — Assessment & Plan Note (Signed)
She feels in the foyer, will send to the ER for further evaluation

## 2014-07-02 NOTE — Telephone Encounter (Signed)
Patient fell outside the office yesterday in the parking lot. She was advised to go to the ED for evaluation. Patient says that the ED told her to hold off on taking her Coumadin medication until Dr. Yetta BarreJones told her she could start back. She wants to know when she can start back taking the Coumadin medication and if she needs to come back in before she does. Please advise.

## 2014-07-02 NOTE — Telephone Encounter (Signed)
PLEASE DISREGARD TELEPHONE NOTE FOR 07/31 FOR DR. Lazarus SalinesLIVESAY--ERROR

## 2014-07-05 NOTE — Telephone Encounter (Signed)
How does she feel?

## 2014-07-06 NOTE — Telephone Encounter (Signed)
Spoke with patient caretaker who states that she c/o headache since fall. Caretaker states that she also restarted her coumadin but has an appointment on 07/08/14 to have it rechecked. I also scheduled an appointment to address headache with another MD due to PCP has nothing available.

## 2014-07-07 ENCOUNTER — Ambulatory Visit (INDEPENDENT_AMBULATORY_CARE_PROVIDER_SITE_OTHER): Payer: Medicare Other | Admitting: Family Medicine

## 2014-07-07 DIAGNOSIS — Z5181 Encounter for therapeutic drug level monitoring: Secondary | ICD-10-CM

## 2014-07-07 DIAGNOSIS — I82409 Acute embolism and thrombosis of unspecified deep veins of unspecified lower extremity: Secondary | ICD-10-CM

## 2014-07-07 LAB — POCT INR: INR: 2.7

## 2014-07-08 ENCOUNTER — Ambulatory Visit: Payer: Medicare Other | Admitting: Internal Medicine

## 2014-07-08 DIAGNOSIS — Z0289 Encounter for other administrative examinations: Secondary | ICD-10-CM

## 2014-07-19 ENCOUNTER — Other Ambulatory Visit: Payer: Self-pay

## 2014-07-19 DIAGNOSIS — I10 Essential (primary) hypertension: Secondary | ICD-10-CM

## 2014-07-19 MED ORDER — NEBIVOLOL HCL 5 MG PO TABS: 5.0000 mg | ORAL_TABLET | Freq: Every day | ORAL | Status: DC

## 2014-07-22 ENCOUNTER — Telehealth: Payer: Self-pay | Admitting: Oncology

## 2014-07-22 NOTE — Telephone Encounter (Signed)
LEFT MESSAGE FOR PATIENT TO RETURN CALL TO SCHEDULE APPT.  °

## 2014-07-22 NOTE — Telephone Encounter (Signed)
S/W PATIENT AND PER PATIENT AS OF NOW NO TRANSPORTATION AND WILL CALL BACK CONTACT NUMBER WAS EVEN.

## 2014-08-03 ENCOUNTER — Ambulatory Visit (HOSPITAL_BASED_OUTPATIENT_CLINIC_OR_DEPARTMENT_OTHER): Payer: Medicare Other | Admitting: Oncology

## 2014-08-03 ENCOUNTER — Other Ambulatory Visit: Payer: Medicare Other

## 2014-08-03 ENCOUNTER — Other Ambulatory Visit: Payer: Self-pay | Admitting: Oncology

## 2014-08-03 VITALS — BP 169/71 | HR 63 | Temp 97.9°F | Resp 18 | Wt 189.1 lb

## 2014-08-03 DIAGNOSIS — D72819 Decreased white blood cell count, unspecified: Secondary | ICD-10-CM

## 2014-08-03 DIAGNOSIS — D61818 Other pancytopenia: Secondary | ICD-10-CM

## 2014-08-03 NOTE — Progress Notes (Signed)
Hematology and Oncology Follow Up Visit  Annette Mckay [id number] Apr 21, 1927 80 y.o. 08/03/2014 3:16 PM    Principle Diagnosis: 80 year old woman with mild leukopenia differential diagnosis included reactive versus early myelodysplasia. This was diagnosed in 2013.  Current therapy: Observation and surveillance.   Interim History: Ms. Annette Mckay presents today for a follow up visit. She is a pleasant elderly woman I saw last back in 2013 reevaluation of leukocytosis. Her white cell count has fluctuated as low as 2.7 with a macrocytosis back in 2012. The last 2 years relatively stable between 3 and 2.7 and 3.5. Clinically she has been doing well since her last visit. She complains predominantly of her lower extremity pain. She has right foot and right knee pain, especially with ambulation. She does not report any constitutional symptoms at this time. She does not report headaches or blurred vision or syncope. She did have a fall but no injury. She is not report any bleeding or thrombosis. She did not report any abdominal pain or distention. She is report any urinary symptoms of frequency urgency or hesitancy. Rest of the review of systems unremarkable.   Medications: I have reviewed the patient's current medications.   Current Outpatient Prescriptions  Medication Sig Dispense Refill  . Calcium Carb-Cholecalciferol (CALCIUM 500 +D) 500-400 MG-UNIT TABS Take 1 tablet by mouth 3 (three) times daily.       . citalopram (CELEXA) 10 MG tablet Take 10 mg by mouth daily.      . cloNIDine (CATAPRES) 0.2 MG tablet Take 1 tablet (0.2 mg total) by mouth 2 (two) times daily.  180 tablet  3  . HYDROcodone-acetaminophen (NORCO) 10-325 MG per tablet Take 1 tablet by mouth every 8 (eight) hours as needed.  90 tablet  0  . losartan-hydrochlorothiazide (HYZAAR) 100-25 MG per tablet Take 1 tablet by mouth daily.      . nebivolol (BYSTOLIC) 5 MG tablet Take 1 tablet (5 mg total) by mouth daily.  90 tablet  3  .  omeprazole (PRILOSEC) 20 MG capsule Take 20 mg by mouth daily.      . potassium chloride (KLOR-CON) 20 MEQ packet Take by mouth daily.      . pravastatin (PRAVACHOL) 40 MG tablet Take 40 mg by mouth daily.      Marland Kitchen warfarin (COUMADIN) 5 MG tablet Take 5 mg by mouth See admin instructions. Take 1/2 tablet on Wednesday. Take one tablet on all other days.       No current facility-administered medications for this visit.    Allergies:  Allergies  Allergen Reactions  . Amlodipine     medium  . Tramadol     hallucinations    Past Medical History, Surgical history, Social history, and Family History were reviewed and updated.   Physical Exam: Blood pressure 169/71, pulse 63, temperature 97.9 F (36.6 C), temperature source Oral, resp. rate 18, weight 189 lb 2 oz (85.787 kg). ECOG: 2 General appearance: alert Head: Normocephalic, without obvious abnormality Neck: no adenopathy Lymph nodes: Cervical, supraclavicular, and axillary nodes normal. Heart:regular rate and rhythm, S1, S2 normal, no murmur, click, rub or gallop Lung:chest clear, no wheezing, rales, normal symmetric air entry Abdomin: soft, non-tender, without masses or organomegaly EXT:no erythema, induration, or nodules   Lab Results: Lab Results  Component Value Date   WBC 2.9* 07/01/2014   HGB 12.1 07/01/2014   HCT 35.6* 07/01/2014   MCV 99.5 07/01/2014   PLT 162.0 07/01/2014     Chemistry  Component Value Date/Time   NA 135 07/01/2014 1624   K 4.4 07/01/2014 1624   CL 100 07/01/2014 1624   CO2 29 07/01/2014 1624   BUN 35* 07/01/2014 1624   CREATININE 1.4* 07/01/2014 1624      Component Value Date/Time   CALCIUM 10.9* 07/01/2014 1624   ALKPHOS 48 07/29/2013 1515   AST 39* 07/29/2013 1515   ALT 23 07/29/2013 1515   BILITOT 0.9 07/29/2013 1515        Impression and Plan:  This is an 80 year old woman with the following issues:  1. Mild leukopenia. Her white cell count was 2.9 on 07/01/2014 with normal  differential as being rather perfectly proportional at this point. The differential diagnosis was discussed today in detail with [name]. This could be a sign of early myelodysplasia versus possible reactive blastic radiation. This has been chronic in nature dating back to at least 2011. I really see no need for any further intervention or workup. I recommended continued observation and surveillance and yearly CBC. She is any other further drop in her counts we will consider reevaluation.  2. Anemia. Her hemoglobin is now normal no intervention is needed.  3. followup: I will be happy to see her in the future as needed if her counts drop below her baseline at any point.    Eli Hose, MD 9/1/20153:16 PM

## 2014-08-04 ENCOUNTER — Other Ambulatory Visit: Payer: Self-pay

## 2014-08-04 MED ORDER — PRAVASTATIN SODIUM 40 MG PO TABS: 40.0000 mg | ORAL_TABLET | Freq: Every day | ORAL | Status: DC

## 2014-08-18 ENCOUNTER — Ambulatory Visit (INDEPENDENT_AMBULATORY_CARE_PROVIDER_SITE_OTHER): Payer: Medicare Other | Admitting: *Deleted

## 2014-08-18 DIAGNOSIS — I82409 Acute embolism and thrombosis of unspecified deep veins of unspecified lower extremity: Secondary | ICD-10-CM

## 2014-08-18 DIAGNOSIS — Z5181 Encounter for therapeutic drug level monitoring: Secondary | ICD-10-CM

## 2014-08-18 LAB — POCT INR: INR: 1.9

## 2014-09-02 ENCOUNTER — Encounter: Payer: Self-pay | Admitting: Gastroenterology

## 2014-09-03 ENCOUNTER — Telehealth: Payer: Self-pay | Admitting: *Deleted

## 2014-09-03 NOTE — Telephone Encounter (Signed)
Pt called for refill on her hydrocodone 06/15/14. MD refilled but pt never picked up rx. Voiding script...Raechel Chute/lmb

## 2014-10-08 ENCOUNTER — Other Ambulatory Visit: Payer: Self-pay | Admitting: *Deleted

## 2014-10-08 MED ORDER — WARFARIN SODIUM 5 MG PO TABS: 5.0000 mg | ORAL_TABLET | ORAL | Status: DC

## 2014-11-01 ENCOUNTER — Encounter: Payer: Self-pay | Admitting: Internal Medicine

## 2014-11-01 ENCOUNTER — Other Ambulatory Visit (INDEPENDENT_AMBULATORY_CARE_PROVIDER_SITE_OTHER): Payer: Medicare Other

## 2014-11-01 ENCOUNTER — Ambulatory Visit (INDEPENDENT_AMBULATORY_CARE_PROVIDER_SITE_OTHER): Payer: Medicare Other | Admitting: Internal Medicine

## 2014-11-01 VITALS — BP 124/86 | HR 61 | Temp 97.3°F | Resp 16 | Ht 63.0 in | Wt 186.0 lb

## 2014-11-01 DIAGNOSIS — M1711 Unilateral primary osteoarthritis, right knee: Secondary | ICD-10-CM

## 2014-11-01 DIAGNOSIS — E7800 Pure hypercholesterolemia, unspecified: Secondary | ICD-10-CM

## 2014-11-01 DIAGNOSIS — N183 Chronic kidney disease, stage 3 unspecified: Secondary | ICD-10-CM

## 2014-11-01 DIAGNOSIS — D61818 Other pancytopenia: Secondary | ICD-10-CM

## 2014-11-01 DIAGNOSIS — I10 Essential (primary) hypertension: Secondary | ICD-10-CM

## 2014-11-01 DIAGNOSIS — Z7901 Long term (current) use of anticoagulants: Secondary | ICD-10-CM

## 2014-11-01 DIAGNOSIS — M170 Bilateral primary osteoarthritis of knee: Secondary | ICD-10-CM

## 2014-11-01 DIAGNOSIS — Z23 Encounter for immunization: Secondary | ICD-10-CM

## 2014-11-01 LAB — PROTIME-INR
INR: 2.9 ratio — ABNORMAL HIGH (ref 0.8–1.0)
Prothrombin Time: 31.4 s — ABNORMAL HIGH (ref 9.6–13.1)

## 2014-11-01 LAB — CBC WITH DIFFERENTIAL/PLATELET
BASOS PCT: 0.7 % (ref 0.0–3.0)
Basophils Absolute: 0 10*3/uL (ref 0.0–0.1)
Eosinophils Absolute: 0.2 10*3/uL (ref 0.0–0.7)
Eosinophils Relative: 5.8 % — ABNORMAL HIGH (ref 0.0–5.0)
HCT: 35.3 % — ABNORMAL LOW (ref 36.0–46.0)
Hemoglobin: 11.8 g/dL — ABNORMAL LOW (ref 12.0–15.0)
LYMPHS ABS: 0.9 10*3/uL (ref 0.7–4.0)
Lymphocytes Relative: 25.8 % (ref 12.0–46.0)
MCHC: 33.4 g/dL (ref 30.0–36.0)
MCV: 101.5 fl — AB (ref 78.0–100.0)
MONO ABS: 0.4 10*3/uL (ref 0.1–1.0)
Monocytes Relative: 10.3 % (ref 3.0–12.0)
Neutro Abs: 2 10*3/uL (ref 1.4–7.7)
Neutrophils Relative %: 57.4 % (ref 43.0–77.0)
PLATELETS: 173 10*3/uL (ref 150.0–400.0)
RBC: 3.48 Mil/uL — AB (ref 3.87–5.11)
RDW: 13.1 % (ref 11.5–15.5)
WBC: 3.5 10*3/uL — ABNORMAL LOW (ref 4.0–10.5)

## 2014-11-01 LAB — LIPID PANEL
CHOL/HDL RATIO: 3
Cholesterol: 131 mg/dL (ref 0–200)
HDL: 40.2 mg/dL (ref >39.00)
LDL CALC: 71 mg/dL (ref 0–99)
NonHDL: 90.8
Triglycerides: 97 mg/dL (ref 0.0–149.0)
VLDL: 19.4 mg/dL (ref 0.0–40.0)

## 2014-11-01 LAB — COMPREHENSIVE METABOLIC PANEL
ALT: 22 U/L (ref 0–35)
AST: 37 U/L (ref 0–37)
Albumin: 3.8 g/dL (ref 3.5–5.2)
Alkaline Phosphatase: 62 U/L (ref 39–117)
BUN: 36 mg/dL — ABNORMAL HIGH (ref 6–23)
CO2: 27 mEq/L (ref 19–32)
Calcium: 10.2 mg/dL (ref 8.4–10.5)
Chloride: 101 mEq/L (ref 96–112)
Creatinine, Ser: 1.3 mg/dL — ABNORMAL HIGH (ref 0.4–1.2)
GFR: 48.68 mL/min — ABNORMAL LOW (ref >60.00)
Glucose, Bld: 89 mg/dL (ref 70–99)
POTASSIUM: 4.2 meq/L (ref 3.5–5.1)
Sodium: 135 mEq/L (ref 135–145)
TOTAL PROTEIN: 8.2 g/dL (ref 6.0–8.3)
Total Bilirubin: 1 mg/dL (ref 0.2–1.2)

## 2014-11-01 LAB — TSH: TSH: 0.77 u[IU]/mL (ref 0.35–4.50)

## 2014-11-01 MED ORDER — HYDROCODONE-ACETAMINOPHEN 10-325 MG PO TABS: 1.0000 | ORAL_TABLET | Freq: Three times a day (TID) | ORAL | Status: DC | PRN

## 2014-11-01 MED ORDER — METHYLPREDNISOLONE ACETATE 40 MG/ML IJ SUSP
40.0000 mg | Freq: Once | INTRAMUSCULAR | Status: AC
  Administered 2014-11-01: 40 mg via INTRA_ARTICULAR

## 2014-11-01 NOTE — Patient Instructions (Signed)

## 2014-11-01 NOTE — Progress Notes (Signed)
Pre visit review using our clinic review tool, if applicable. No additional management support is needed unless otherwise documented below in the visit note. 

## 2014-11-02 ENCOUNTER — Encounter: Payer: Self-pay | Admitting: Internal Medicine

## 2014-11-02 NOTE — Assessment & Plan Note (Signed)
Her INR is in the therapeutic range 

## 2014-11-02 NOTE — Assessment & Plan Note (Signed)
CBC is stable Will follow for now

## 2014-11-02 NOTE — Assessment & Plan Note (Signed)
Her renal function is stable 

## 2014-11-02 NOTE — Progress Notes (Signed)
Subjective:    Patient ID: Annette Mckay, female    DOB: 1927/11/27, 80 y.o.   MRN: [id number]  Hypertension This is a chronic problem. The current episode started more than 1 year ago. The problem has been gradually improving since onset. The problem is controlled. Pertinent negatives include no anxiety, blurred vision, chest pain, headaches, malaise/fatigue, neck pain, orthopnea, palpitations, peripheral edema, PND, shortness of breath or sweats. Past treatments include central alpha agonists, beta blockers, diuretics and angiotensin blockers. The current treatment provides significant improvement. Compliance problems include diet and exercise.  Hypertensive end-organ damage includes kidney disease. Identifiable causes of hypertension include chronic renal disease.      Review of Systems  Constitutional: Negative.  Negative for fever, chills, malaise/fatigue, diaphoresis, appetite change and fatigue.  HENT: Negative.   Eyes: Negative.  Negative for blurred vision.  Respiratory: Negative.  Negative for cough, choking, chest tightness, shortness of breath and stridor.   Cardiovascular: Negative.  Negative for chest pain, palpitations, orthopnea, leg swelling and PND.  Gastrointestinal: Negative.  Negative for nausea, vomiting, abdominal pain, diarrhea, constipation and blood in stool.  Endocrine: Negative.   Genitourinary: Negative.   Musculoskeletal: Positive for arthralgias (bilateral knee pain R>>>L). Negative for myalgias, back pain, joint swelling, gait problem, neck pain and neck stiffness.  Skin: Negative.  Negative for rash.  Allergic/Immunologic: Negative.   Neurological: Negative.  Negative for headaches.  Hematological: Negative.  Negative for adenopathy. Does not bruise/bleed easily.  Psychiatric/Behavioral: Negative.        Objective:   Physical Exam  Constitutional: She is oriented to person, place, and time. She appears well-developed and well-nourished. No distress.    HENT:  Head: Normocephalic and atraumatic.  Mouth/Throat: Oropharynx is clear and moist. No oropharyngeal exudate.  Eyes: Conjunctivae are normal. Right eye exhibits no discharge. Left eye exhibits no discharge. No scleral icterus.  Neck: Normal range of motion. Neck supple. No JVD present. No tracheal deviation present. No thyromegaly present.  Cardiovascular: Normal rate, regular rhythm, normal heart sounds and intact distal pulses.  Exam reveals no gallop and no friction rub.   No murmur heard. Pulmonary/Chest: Effort normal and breath sounds normal. No stridor. No respiratory distress. She has no wheezes. She has no rales. She exhibits no tenderness.  Abdominal: Soft. Bowel sounds are normal. She exhibits no distension and no mass. There is no tenderness. There is no rebound and no guarding.  Musculoskeletal: Normal range of motion. She exhibits no edema or tenderness.       Right knee: She exhibits deformity (DJD changes). She exhibits normal range of motion, no swelling, no effusion, no ecchymosis, no laceration, no erythema, normal alignment, no LCL laxity, normal patellar mobility and no bony tenderness. No tenderness found.       Left knee: She exhibits deformity (DJD). She exhibits normal range of motion, no swelling, no effusion, no ecchymosis, no laceration, no erythema, normal alignment, no LCL laxity, normal patellar mobility and no bony tenderness. No tenderness found.  Rt knee prepped and draped in sterile fashion, using a 1.5 in 23 gauge needle the joint space was easily entered and 40 mg depo-medrol and 1 cc of plain 0.5% marcaine was easily injected into the joint. She tolerated this well with no complications.  Lymphadenopathy:    She has no cervical adenopathy.  Neurological: She is oriented to person, place, and time.  Skin: Skin is warm and dry. No rash noted. She is not diaphoretic. No erythema. No pallor.  Psychiatric:  She has a normal mood and affect. Her behavior is  normal. Judgment and thought content normal.  Vitals reviewed.    Lab Results  Component Value Date   WBC 3.5* 11/01/2014   HGB 11.8* 11/01/2014   HCT 35.3* 11/01/2014   PLT 173.0 11/01/2014   GLUCOSE 89 11/01/2014   CHOL 131 11/01/2014   TRIG 97.0 11/01/2014   HDL 40.20 11/01/2014   LDLCALC 71 11/01/2014   ALT 22 11/01/2014   AST 37 11/01/2014   NA 135 11/01/2014   K 4.2 11/01/2014   CL 101 11/01/2014   CREATININE 1.3* 11/01/2014   BUN 36* 11/01/2014   CO2 27 11/01/2014   TSH 0.77 11/01/2014   INR 2.9* 11/01/2014   HGBA1C 5.3 03/12/2012       Assessment & Plan:

## 2014-11-02 NOTE — Assessment & Plan Note (Signed)
Her BP is well controlled Lytes and renal function are stable 

## 2014-11-02 NOTE — Assessment & Plan Note (Signed)
Depo-medrol IA injection today Will cont norco as needed for pain

## 2014-11-04 NOTE — Addendum Note (Signed)
Addended by: Etta GrandchildJONES, Kenidi Elenbaas L on: 11/04/2014 09:35 AM   Modules accepted: Kipp BroodSmartSet

## 2014-11-17 ENCOUNTER — Other Ambulatory Visit: Payer: Self-pay | Admitting: Internal Medicine

## 2015-01-04 ENCOUNTER — Other Ambulatory Visit: Payer: Self-pay | Admitting: Internal Medicine

## 2015-01-18 ENCOUNTER — Ambulatory Visit: Payer: Medicare Other

## 2015-01-19 ENCOUNTER — Ambulatory Visit (INDEPENDENT_AMBULATORY_CARE_PROVIDER_SITE_OTHER): Payer: Medicare Other | Admitting: Internal Medicine

## 2015-01-19 ENCOUNTER — Ambulatory Visit: Payer: Medicare Other | Admitting: Internal Medicine

## 2015-01-19 ENCOUNTER — Encounter: Payer: Self-pay | Admitting: Internal Medicine

## 2015-01-19 ENCOUNTER — Ambulatory Visit (INDEPENDENT_AMBULATORY_CARE_PROVIDER_SITE_OTHER): Payer: Medicare Other | Admitting: General Practice

## 2015-01-19 VITALS — BP 146/70 | HR 58 | Temp 98.7°F | Resp 16 | Ht 63.0 in | Wt 188.0 lb

## 2015-01-19 DIAGNOSIS — M545 Low back pain, unspecified: Secondary | ICD-10-CM

## 2015-01-19 DIAGNOSIS — I10 Essential (primary) hypertension: Secondary | ICD-10-CM

## 2015-01-19 DIAGNOSIS — Z5181 Encounter for therapeutic drug level monitoring: Secondary | ICD-10-CM

## 2015-01-19 DIAGNOSIS — J302 Other seasonal allergic rhinitis: Secondary | ICD-10-CM

## 2015-01-19 DIAGNOSIS — M170 Bilateral primary osteoarthritis of knee: Secondary | ICD-10-CM

## 2015-01-19 LAB — POCT INR: INR: 3

## 2015-01-19 MED ORDER — HYDROCODONE-ACETAMINOPHEN 10-325 MG PO TABS: 1.0000 | ORAL_TABLET | Freq: Three times a day (TID) | ORAL | Status: DC | PRN

## 2015-01-19 MED ORDER — BECLOMETHASONE DIPROPIONATE 80 MCG/ACT NA AERS: 4.0000 | INHALATION_SPRAY | Freq: Every day | NASAL | Status: DC

## 2015-01-19 NOTE — Progress Notes (Signed)
Subjective:    Patient ID: Annette LeighCora J Mckay, female    DOB: [birth year], 81 y.o.   MRN: [id number]  Hypertension This is a chronic problem. The current episode started more than 1 year ago. The problem is unchanged. The problem is controlled. Pertinent negatives include no anxiety, blurred vision, chest pain, headaches, malaise/fatigue, neck pain, orthopnea, palpitations, peripheral edema, PND, shortness of breath or sweats. There are no associated agents to hypertension. Past treatments include beta blockers, angiotensin blockers and diuretics. The current treatment provides moderate improvement. Compliance problems include exercise and diet.       Review of Systems  Constitutional: Negative.  Negative for fever, chills, malaise/fatigue, diaphoresis, appetite change and fatigue.  HENT: Positive for congestion, postnasal drip and rhinorrhea. Negative for nosebleeds, sinus pressure, sneezing, sore throat, tinnitus and voice change.   Eyes: Negative.  Negative for blurred vision.  Respiratory: Negative.  Negative for cough, choking, chest tightness, shortness of breath, wheezing and stridor.   Cardiovascular: Negative.  Negative for chest pain, palpitations, orthopnea, leg swelling and PND.  Gastrointestinal: Negative.  Negative for nausea, vomiting, abdominal pain, diarrhea and blood in stool.  Endocrine: Negative.   Genitourinary: Negative.   Musculoskeletal: Positive for arthralgias. Negative for myalgias, back pain and neck pain.  Skin: Negative.   Allergic/Immunologic: Negative.   Neurological: Negative.  Negative for dizziness and headaches.  Hematological: Negative.  Negative for adenopathy. Does not bruise/bleed easily.  Psychiatric/Behavioral: Negative.        Objective:   Physical Exam  Constitutional: She appears well-developed and well-nourished. No distress.  HENT:  Head: Normocephalic and atraumatic.  Right Ear: Hearing, tympanic membrane, external ear and ear canal normal.   Left Ear: Hearing, tympanic membrane, external ear and ear canal normal.  Nose: Mucosal edema and rhinorrhea present. No sinus tenderness, nasal deformity or septal deviation. Right sinus exhibits no maxillary sinus tenderness and no frontal sinus tenderness. Left sinus exhibits no maxillary sinus tenderness.  Mouth/Throat: Oropharynx is clear and moist and mucous membranes are normal. Mucous membranes are not pale, not dry and not cyanotic. No oral lesions. No trismus in the jaw. No uvula swelling. No oropharyngeal exudate, posterior oropharyngeal edema, posterior oropharyngeal erythema or tonsillar abscesses.  Eyes: Conjunctivae are normal. Right eye exhibits no discharge. Left eye exhibits no discharge. No scleral icterus.  Neck: Normal range of motion. Neck supple. No JVD present. No tracheal deviation present. No thyromegaly present.  Cardiovascular: Normal rate, regular rhythm, normal heart sounds and intact distal pulses.  Exam reveals no gallop and no friction rub.   No murmur heard. Pulmonary/Chest: Effort normal and breath sounds normal. No stridor. No respiratory distress. She has no wheezes. She has no rales. She exhibits no tenderness.  Abdominal: Soft. Bowel sounds are normal. She exhibits no distension and no mass. There is no tenderness. There is no rebound and no guarding.  Musculoskeletal: Normal range of motion. She exhibits no edema or tenderness.       Right shoulder: She exhibits no deformity (DJD).       Right knee: She exhibits deformity (DJD).       Left knee: She exhibits deformity (DJD).  Lymphadenopathy:    She has no cervical adenopathy.  Skin: Skin is warm and dry. No rash noted. She is not diaphoretic. No erythema. No pallor.  Nursing note and vitals reviewed.    Lab Results  Component Value Date   WBC 3.5* 11/01/2014   HGB 11.8* 11/01/2014   HCT 35.3* 11/01/2014  PLT 173.0 11/01/2014   GLUCOSE 89 11/01/2014   CHOL 131 11/01/2014   TRIG 97.0 11/01/2014    HDL 40.20 11/01/2014   LDLCALC 71 11/01/2014   ALT 22 11/01/2014   AST 37 11/01/2014   NA 135 11/01/2014   K 4.2 11/01/2014   CL 101 11/01/2014   CREATININE 1.3* 11/01/2014   BUN 36* 11/01/2014   CO2 27 11/01/2014   TSH 0.77 11/01/2014   INR 3.0 01/19/2015   HGBA1C 5.3 03/12/2012       Assessment & Plan:

## 2015-01-19 NOTE — Progress Notes (Signed)
Pre visit review using our clinic review tool, if applicable. No additional management support is needed unless otherwise documented below in the visit note. 

## 2015-01-19 NOTE — Progress Notes (Signed)
Agree with plan 

## 2015-01-19 NOTE — Patient Instructions (Signed)

## 2015-01-20 NOTE — Assessment & Plan Note (Signed)
Her BP is well controlled 

## 2015-01-20 NOTE — Assessment & Plan Note (Signed)
She is getting pain relief with norco

## 2015-01-20 NOTE — Assessment & Plan Note (Signed)
Will treat with Qnasl ns 

## 2015-01-21 ENCOUNTER — Telehealth: Payer: Self-pay

## 2015-01-21 DIAGNOSIS — J302 Other seasonal allergic rhinitis: Secondary | ICD-10-CM

## 2015-01-21 MED ORDER — MOMETASONE FUROATE 50 MCG/ACT NA SUSP: 4.0000 | Freq: Every day | NASAL | Status: AC

## 2015-01-21 NOTE — Telephone Encounter (Signed)
changed

## 2015-01-21 NOTE — Addendum Note (Signed)
Addended by: Etta GrandchildJONES, Janylah Belgrave L on: 01/21/2015 05:00 PM   Modules accepted: Orders, Medications

## 2015-01-21 NOTE — Telephone Encounter (Signed)
Received fax stating Qnasal not covered by insurance, alternatives are fluticasone, aselcitine, or nasonex. Thanks

## 2015-03-02 ENCOUNTER — Ambulatory Visit (INDEPENDENT_AMBULATORY_CARE_PROVIDER_SITE_OTHER): Payer: Medicare Other | Admitting: General Practice

## 2015-03-02 ENCOUNTER — Ambulatory Visit: Payer: Medicare Other | Admitting: Internal Medicine

## 2015-03-02 DIAGNOSIS — Z5181 Encounter for therapeutic drug level monitoring: Secondary | ICD-10-CM

## 2015-03-02 DIAGNOSIS — I82409 Acute embolism and thrombosis of unspecified deep veins of unspecified lower extremity: Secondary | ICD-10-CM | POA: Diagnosis not present

## 2015-03-02 LAB — POCT INR: INR: 1.9

## 2015-03-02 NOTE — Progress Notes (Signed)
Agree with plan 

## 2015-03-02 NOTE — Progress Notes (Signed)
Pre visit review using our clinic review tool, if applicable. No additional management support is needed unless otherwise documented below in the visit note. 

## 2015-03-17 ENCOUNTER — Inpatient Hospital Stay (HOSPITAL_COMMUNITY): Admission: RE | Admit: 2015-03-17 | Payer: Self-pay | Source: Ambulatory Visit

## 2015-03-17 ENCOUNTER — Other Ambulatory Visit (HOSPITAL_COMMUNITY): Payer: Self-pay | Admitting: Foot & Ankle Surgery

## 2015-03-17 ENCOUNTER — Ambulatory Visit (HOSPITAL_COMMUNITY)
Admission: RE | Admit: 2015-03-17 | Discharge: 2015-03-17 | Disposition: A | Discharge status: Home / Self Care | Payer: Medicare Other | Source: Ambulatory Visit | Attending: Cardiovascular Disease | Admitting: Cardiovascular Disease

## 2015-03-17 ENCOUNTER — Other Ambulatory Visit: Payer: Self-pay | Admitting: *Deleted

## 2015-03-17 DIAGNOSIS — M79604 Pain in right leg: Secondary | ICD-10-CM

## 2015-03-17 DIAGNOSIS — M79606 Pain in leg, unspecified: Secondary | ICD-10-CM

## 2015-03-17 DIAGNOSIS — M79605 Pain in left leg: Principal | ICD-10-CM

## 2015-03-17 DIAGNOSIS — M7989 Other specified soft tissue disorders: Principal | ICD-10-CM

## 2015-03-17 NOTE — Progress Notes (Signed)
Bilateral lower extremity venous duplex completed. No evidence for DVT, SVT, or Baker's cyst. °Brianna L Mazza,RVT °

## 2015-03-23 ENCOUNTER — Telehealth (HOSPITAL_COMMUNITY): Payer: Self-pay | Admitting: *Deleted

## 2015-03-29 ENCOUNTER — Ambulatory Visit: Payer: Medicare Other

## 2015-03-29 ENCOUNTER — Ambulatory Visit (INDEPENDENT_AMBULATORY_CARE_PROVIDER_SITE_OTHER): Payer: Medicare Other | Admitting: General Practice

## 2015-03-29 DIAGNOSIS — Z5181 Encounter for therapeutic drug level monitoring: Secondary | ICD-10-CM | POA: Diagnosis not present

## 2015-03-29 LAB — POCT INR: INR: 3.4

## 2015-03-29 NOTE — Progress Notes (Signed)
Pre visit review using our clinic review tool, if applicable. No additional management support is needed unless otherwise documented below in the visit note. 

## 2015-03-30 NOTE — Progress Notes (Signed)
Agree with plan 

## 2015-04-05 ENCOUNTER — Other Ambulatory Visit: Payer: Self-pay | Admitting: Internal Medicine

## 2015-04-20 ENCOUNTER — Encounter: Payer: Self-pay | Admitting: Internal Medicine

## 2015-04-20 ENCOUNTER — Other Ambulatory Visit (INDEPENDENT_AMBULATORY_CARE_PROVIDER_SITE_OTHER): Payer: Medicare Other

## 2015-04-20 ENCOUNTER — Ambulatory Visit (INDEPENDENT_AMBULATORY_CARE_PROVIDER_SITE_OTHER): Payer: Medicare Other | Admitting: Internal Medicine

## 2015-04-20 ENCOUNTER — Ambulatory Visit: Payer: Medicare Other

## 2015-04-20 VITALS — BP 158/78 | HR 60 | Temp 98.0°F | Resp 16 | Ht 63.0 in | Wt 184.2 lb

## 2015-04-20 DIAGNOSIS — N183 Chronic kidney disease, stage 3 unspecified: Secondary | ICD-10-CM

## 2015-04-20 DIAGNOSIS — M170 Bilateral primary osteoarthritis of knee: Secondary | ICD-10-CM | POA: Diagnosis not present

## 2015-04-20 DIAGNOSIS — D61818 Other pancytopenia: Secondary | ICD-10-CM

## 2015-04-20 DIAGNOSIS — I82519 Chronic embolism and thrombosis of unspecified femoral vein: Secondary | ICD-10-CM | POA: Insufficient documentation

## 2015-04-20 DIAGNOSIS — I10 Essential (primary) hypertension: Secondary | ICD-10-CM | POA: Diagnosis not present

## 2015-04-20 DIAGNOSIS — I82513 Chronic embolism and thrombosis of femoral vein, bilateral: Secondary | ICD-10-CM | POA: Diagnosis not present

## 2015-04-20 DIAGNOSIS — Z5181 Encounter for therapeutic drug level monitoring: Secondary | ICD-10-CM

## 2015-04-20 DIAGNOSIS — M545 Low back pain, unspecified: Secondary | ICD-10-CM

## 2015-04-20 DIAGNOSIS — I82409 Acute embolism and thrombosis of unspecified deep veins of unspecified lower extremity: Secondary | ICD-10-CM

## 2015-04-20 LAB — URINALYSIS, ROUTINE W REFLEX MICROSCOPIC
Bilirubin Urine: NEGATIVE
Hgb urine dipstick: NEGATIVE
KETONES UR: NEGATIVE
Leukocytes, UA: NEGATIVE
Nitrite: NEGATIVE
PH: 6 (ref 5.0–8.0)
SPECIFIC GRAVITY, URINE: 1.025 (ref 1.000–1.030)
TOTAL PROTEIN, URINE-UPE24: NEGATIVE
URINE GLUCOSE: NEGATIVE
UROBILINOGEN UA: 4 — AB (ref 0.0–1.0)

## 2015-04-20 LAB — CBC WITH DIFFERENTIAL/PLATELET
Basophils Absolute: 0 10*3/uL (ref 0.0–0.1)
Basophils Relative: 0.5 % (ref 0.0–3.0)
Eosinophils Absolute: 0.1 10*3/uL (ref 0.0–0.7)
Eosinophils Relative: 2.3 % (ref 0.0–5.0)
HCT: 34 % — ABNORMAL LOW (ref 36.0–46.0)
HEMOGLOBIN: 11.6 g/dL — AB (ref 12.0–15.0)
Lymphocytes Relative: 32.1 % (ref 12.0–46.0)
Lymphs Abs: 1.1 10*3/uL (ref 0.7–4.0)
MCHC: 34.2 g/dL (ref 30.0–36.0)
MCV: 98.9 fl (ref 78.0–100.0)
MONOS PCT: 8.5 % (ref 3.0–12.0)
Monocytes Absolute: 0.3 10*3/uL (ref 0.1–1.0)
NEUTROS ABS: 2 10*3/uL (ref 1.4–7.7)
Neutrophils Relative %: 56.6 % (ref 43.0–77.0)
Platelets: 151 10*3/uL (ref 150.0–400.0)
RBC: 3.44 Mil/uL — AB (ref 3.87–5.11)
RDW: 13.3 % (ref 11.5–15.5)
WBC: 3.5 10*3/uL — ABNORMAL LOW (ref 4.0–10.5)

## 2015-04-20 LAB — PROTIME-INR
INR: 2.5 ratio — AB (ref 0.8–1.0)
Prothrombin Time: 27.2 s — ABNORMAL HIGH (ref 9.6–13.1)

## 2015-04-20 LAB — COMPREHENSIVE METABOLIC PANEL
ALBUMIN: 4 g/dL (ref 3.5–5.2)
ALT: 19 U/L (ref 0–35)
AST: 28 U/L (ref 0–37)
Alkaline Phosphatase: 58 U/L (ref 39–117)
BUN: 39 mg/dL — ABNORMAL HIGH (ref 6–23)
CALCIUM: 10.8 mg/dL — AB (ref 8.4–10.5)
CHLORIDE: 105 meq/L (ref 96–112)
CO2: 28 mEq/L (ref 19–32)
Creatinine, Ser: 1.35 mg/dL — ABNORMAL HIGH (ref 0.40–1.20)
GFR: 47.8 mL/min — ABNORMAL LOW (ref >60.00)
Glucose, Bld: 90 mg/dL (ref 70–99)
POTASSIUM: 4.5 meq/L (ref 3.5–5.1)
Sodium: 138 mEq/L (ref 135–145)
Total Bilirubin: 0.9 mg/dL (ref 0.2–1.2)
Total Protein: 8 g/dL (ref 6.0–8.3)

## 2015-04-20 MED ORDER — HYDROCODONE-ACETAMINOPHEN 10-325 MG PO TABS: 1.0000 | ORAL_TABLET | Freq: Three times a day (TID) | ORAL | Status: DC | PRN

## 2015-04-20 NOTE — Patient Instructions (Signed)

## 2015-04-20 NOTE — Progress Notes (Signed)
Pre visit review using our clinic review tool, if applicable. No additional management support is needed unless otherwise documented below in the visit note. 

## 2015-04-20 NOTE — Progress Notes (Signed)
Subjective:  Patient ID: Annette Mckay, female    DOB: December 30, 1926  Age: 81 y.o. MRN: [id number]  CC: Hypertension   HPI Annette Mckay presents for follow up on HTN and other medical problems, she is due for a PT-INR, she tells me that she has had multiple clots in her LE's up to the thigh and needs to stay on coumadin for lifetime. She has persistent knee pain and needs a refill on her pain meds.  Outpatient Prescriptions Prior to Visit  Medication Sig Dispense Refill  . Calcium Carb-Cholecalciferol (CALCIUM 500 +D) 500-400 MG-UNIT TABS Take 1 tablet by mouth 3 (three) times daily.     . citalopram (CELEXA) 10 MG tablet Take 10 mg by mouth daily.    . cloNIDine (CATAPRES) 0.2 MG tablet TAKE ONE TABLET BY MOUTH 2 TIMES A DAY FOR BLOOD PRESSURE. 180 tablet 1  . losartan-hydrochlorothiazide (HYZAAR) 100-25 MG per tablet TAKE ONE TABLET BY MOUTH DAILY FOR HIGH BLOOD PRESSURE. 90 tablet 1  . mometasone (NASONEX) 50 MCG/ACT nasal spray Place 4 sprays into the nose daily. 17 g 12  . nebivolol (BYSTOLIC) 5 MG tablet Take 1 tablet (5 mg total) by mouth daily. 90 tablet 3  . omeprazole (PRILOSEC) 20 MG capsule TAKE ONE CAPSULE BY MOUTH EVERY DAY FOR INDIGESTION. 90 capsule 1  . potassium chloride SA (K-DUR,KLOR-CON) 20 MEQ tablet TAKE ONE TABLET BY MOUTH 2 TIMES A DAY. 180 tablet 1  . pravastatin (PRAVACHOL) 40 MG tablet Take 1 tablet (40 mg total) by mouth daily. 30 tablet 11  . warfarin (COUMADIN) 5 MG tablet TAKE ONE TABLET BY MOUTH AS DIRECTED BY CLINIC. 35 tablet 3  . HYDROcodone-acetaminophen (NORCO) 10-325 MG per tablet Take 1 tablet by mouth every 8 (eight) hours as needed. 90 tablet 0   No facility-administered medications prior to visit.    ROS Review of Systems  Constitutional: Positive for fatigue. Negative for fever, chills, diaphoresis and appetite change.  HENT: Negative.   Eyes: Negative.   Respiratory: Negative.  Negative for cough, choking, chest tightness, shortness of  breath and stridor.   Cardiovascular: Negative.  Negative for chest pain, palpitations and leg swelling.  Gastrointestinal: Negative.  Negative for nausea, vomiting, abdominal pain, diarrhea, constipation and blood in stool.  Endocrine: Negative.   Genitourinary: Negative.   Musculoskeletal: Positive for back pain. Negative for joint swelling, arthralgias, gait problem and neck pain.  Skin: Negative.   Allergic/Immunologic: Negative.   Neurological: Negative.  Negative for dizziness and weakness.  Hematological: Negative.  Negative for adenopathy. Does not bruise/bleed easily.  Psychiatric/Behavioral: Negative.     Objective:  BP 158/78 mmHg  Pulse 60  Temp(Src) 98 F (36.7 C) (Oral)  Resp 16  Ht  (1.6 m)  Wt 184 lb 4 oz (83.575 kg)  BMI 32.65 kg/m2  SpO2 99%  BP Readings from Last 3 Encounters:  04/20/15 158/78  01/19/15 146/70  11/01/14 124/86    Wt Readings from Last 3 Encounters:  04/20/15 184 lb 4 oz (83.575 kg)  01/19/15 188 lb (85.276 kg)  11/01/14 186 lb (84.369 kg)    Physical Exam  Constitutional: She is oriented to person, place, and time. She appears well-developed and well-nourished.  Non-toxic appearance. She has a sickly appearance. She does not appear ill. No distress.  Wheel chair bound  HENT:  Head: Normocephalic and atraumatic.  Mouth/Throat: Oropharynx is clear and moist. No oropharyngeal exudate.  Eyes: Conjunctivae are normal. Right eye exhibits no discharge.  Left eye exhibits no discharge. No scleral icterus.  Neck: Normal range of motion. Neck supple. No JVD present. No tracheal deviation present. No thyromegaly present.  Cardiovascular: Normal rate, regular rhythm, normal heart sounds and intact distal pulses.  Exam reveals no gallop and no friction rub.   No murmur heard. Pulmonary/Chest: Effort normal and breath sounds normal. No stridor. No respiratory distress. She has no wheezes. She has no rales. She exhibits no tenderness.    Abdominal: Soft. Bowel sounds are normal. She exhibits no distension and no mass. There is no tenderness. There is no rebound and no guarding.  Musculoskeletal: Normal range of motion. She exhibits no edema.       Right knee: She exhibits deformity (+DJD) and bony tenderness. She exhibits normal range of motion, no swelling, no effusion, no ecchymosis and no erythema. Tenderness found.       Left knee: She exhibits deformity (+DJD). She exhibits normal range of motion, no swelling, no effusion, no ecchymosis, no laceration, no erythema and normal alignment. Tenderness found.  Lymphadenopathy:    She has no cervical adenopathy.  Neurological: She is oriented to person, place, and time.  Skin: Skin is warm and dry. No rash noted. She is not diaphoretic. No erythema. No pallor.  Psychiatric: She has a normal Mckay and affect. Her behavior is normal. Judgment and thought content normal.  Vitals reviewed.   Lab Results  Component Value Date   WBC 3.5* 11/01/2014   HGB 11.8* 11/01/2014   HCT 35.3* 11/01/2014   PLT 173.0 11/01/2014   GLUCOSE 89 11/01/2014   CHOL 131 11/01/2014   TRIG 97.0 11/01/2014   HDL 40.20 11/01/2014   LDLCALC 71 11/01/2014   ALT 22 11/01/2014   AST 37 11/01/2014   NA 135 11/01/2014   K 4.2 11/01/2014   CL 101 11/01/2014   CREATININE 1.3* 11/01/2014   BUN 36* 11/01/2014   CO2 27 11/01/2014   TSH 0.77 11/01/2014   INR 3.4 03/29/2015   HGBA1C 5.3 03/12/2012    No results found.  Assessment & Plan:   Ivor MessierCora was seen today for hypertension.  Diagnoses and all orders for this visit:  Encounter for therapeutic drug monitoring Orders: -     Protime-INR; Future  Kidney disease, chronic, stage III (GFR 30-59 ml/min) Orders: -     Comprehensive metabolic panel; Future -     Urinalysis, Routine w reflex microscopic; Future  Essential hypertension Orders: -     Comprehensive metabolic panel; Future -     Urinalysis, Routine w reflex microscopic;  Future  Primary osteoarthritis of both knees Orders: -     Discontinue: HYDROcodone-acetaminophen (NORCO) 10-325 MG per tablet; Take 1 tablet by mouth every 8 (eight) hours as needed. -     Discontinue: HYDROcodone-acetaminophen (NORCO) 10-325 MG per tablet; Take 1 tablet by mouth every 8 (eight) hours as needed. -     Discontinue: HYDROcodone-acetaminophen (NORCO) 10-325 MG per tablet; Take 1 tablet by mouth every 8 (eight) hours as needed. -     HYDROcodone-acetaminophen (NORCO) 10-325 MG per tablet; Take 1 tablet by mouth every 8 (eight) hours as needed.  Midline low back pain without sciatica Orders: -     Discontinue: HYDROcodone-acetaminophen (NORCO) 10-325 MG per tablet; Take 1 tablet by mouth every 8 (eight) hours as needed. -     Discontinue: HYDROcodone-acetaminophen (NORCO) 10-325 MG per tablet; Take 1 tablet by mouth every 8 (eight) hours as needed. -     Discontinue: HYDROcodone-acetaminophen (  NORCO) 10-325 MG per tablet; Take 1 tablet by mouth every 8 (eight) hours as needed. -     HYDROcodone-acetaminophen (NORCO) 10-325 MG per tablet; Take 1 tablet by mouth every 8 (eight) hours as needed.  DVT, femoral, chronic, bilateral Orders: -     Protime-INR; Future  Pancytopenia Orders: -     CBC with Differential/Platelet; Future  Comments - Her BP is adequately well controlled, will cont norco, she is not willing to see hematology anymore so I will monitor her CBC to see if there has been a progression of the bone marrow suppression. Will cont coumadin and monitor the PT/INR.  I am having Ms. Dimitri maintain her Calcium Carb-Cholecalciferol, citalopram, nebivolol, pravastatin, potassium chloride SA, mometasone, cloNIDine, omeprazole, losartan-hydrochlorothiazide, warfarin, and HYDROcodone-acetaminophen.  Meds ordered this encounter  Medications  . DISCONTD: HYDROcodone-acetaminophen (NORCO) 10-325 MG per tablet    Sig: Take 1 tablet by mouth every 8 (eight) hours as needed.     Dispense:  90 tablet    Refill:  0    Fill on or after 04/20/15  . DISCONTD: HYDROcodone-acetaminophen (NORCO) 10-325 MG per tablet    Sig: Take 1 tablet by mouth every 8 (eight) hours as needed.    Dispense:  90 tablet    Refill:  0    Fill on or after 05/21/15  . DISCONTD: HYDROcodone-acetaminophen (NORCO) 10-325 MG per tablet    Sig: Take 1 tablet by mouth every 8 (eight) hours as needed.    Dispense:  90 tablet    Refill:  0    Fill on or after 06/20/15  . HYDROcodone-acetaminophen (NORCO) 10-325 MG per tablet    Sig: Take 1 tablet by mouth every 8 (eight) hours as needed.    Dispense:  90 tablet    Refill:  0    Fill on or after 07/21/15     Follow-up: Return in about 4 months (around 08/21/2015).  Sanda Lingerhomas Kalel Harty, MD

## 2015-04-21 ENCOUNTER — Telehealth: Payer: Self-pay | Admitting: General Practice

## 2015-04-21 ENCOUNTER — Encounter: Payer: Self-pay | Admitting: Internal Medicine

## 2015-04-21 ENCOUNTER — Ambulatory Visit (INDEPENDENT_AMBULATORY_CARE_PROVIDER_SITE_OTHER): Payer: Medicare Other | Admitting: General Practice

## 2015-04-21 DIAGNOSIS — Z5181 Encounter for therapeutic drug level monitoring: Secondary | ICD-10-CM

## 2015-04-21 NOTE — Progress Notes (Signed)
Pre visit review using our clinic review tool, if applicable. No additional management support is needed unless otherwise documented below in the visit note. 

## 2015-04-21 NOTE — Telephone Encounter (Signed)
Left dosing instructions on VM.

## 2015-04-25 ENCOUNTER — Telehealth: Payer: Self-pay | Admitting: Internal Medicine

## 2015-04-25 NOTE — Telephone Encounter (Signed)
Please send a copy lab and urinalysis to patient

## 2015-04-26 NOTE — Telephone Encounter (Signed)
MD has mail out lab letter will resend....Raechel Chute/lmb

## 2015-05-24 ENCOUNTER — Ambulatory Visit (INDEPENDENT_AMBULATORY_CARE_PROVIDER_SITE_OTHER): Payer: Medicare Other | Admitting: General Practice

## 2015-05-24 DIAGNOSIS — I82409 Acute embolism and thrombosis of unspecified deep veins of unspecified lower extremity: Secondary | ICD-10-CM | POA: Diagnosis not present

## 2015-05-24 DIAGNOSIS — Z5181 Encounter for therapeutic drug level monitoring: Secondary | ICD-10-CM

## 2015-05-24 LAB — POCT INR: INR: 2.5

## 2015-05-24 NOTE — Progress Notes (Signed)
Pre visit review using our clinic review tool, if applicable. No additional management support is needed unless otherwise documented below in the visit note. 

## 2015-05-24 NOTE — Progress Notes (Signed)
I have reviewed and agree with the plan. 

## 2015-06-21 ENCOUNTER — Ambulatory Visit (INDEPENDENT_AMBULATORY_CARE_PROVIDER_SITE_OTHER): Payer: Medicare Other | Admitting: General Practice

## 2015-06-21 DIAGNOSIS — I82409 Acute embolism and thrombosis of unspecified deep veins of unspecified lower extremity: Secondary | ICD-10-CM | POA: Diagnosis not present

## 2015-06-21 DIAGNOSIS — Z5181 Encounter for therapeutic drug level monitoring: Secondary | ICD-10-CM | POA: Diagnosis not present

## 2015-06-21 LAB — POCT INR: INR: 2.6

## 2015-06-21 NOTE — Progress Notes (Signed)
Pre visit review using our clinic review tool, if applicable. No additional management support is needed unless otherwise documented below in the visit note. 

## 2015-06-21 NOTE — Progress Notes (Signed)
I have reviewed and agree with the plan. 

## 2015-06-23 ENCOUNTER — Other Ambulatory Visit: Payer: Self-pay | Admitting: Internal Medicine

## 2015-07-21 ENCOUNTER — Encounter: Payer: Self-pay | Admitting: Internal Medicine

## 2015-07-21 ENCOUNTER — Other Ambulatory Visit (INDEPENDENT_AMBULATORY_CARE_PROVIDER_SITE_OTHER): Payer: Medicare Other

## 2015-07-21 ENCOUNTER — Ambulatory Visit (INDEPENDENT_AMBULATORY_CARE_PROVIDER_SITE_OTHER): Payer: Medicare Other | Admitting: Internal Medicine

## 2015-07-21 VITALS — BP 168/84 | HR 72 | Temp 98.6°F | Resp 16 | Ht 63.0 in | Wt 182.0 lb

## 2015-07-21 DIAGNOSIS — J3089 Other allergic rhinitis: Secondary | ICD-10-CM

## 2015-07-21 DIAGNOSIS — N183 Chronic kidney disease, stage 3 unspecified: Secondary | ICD-10-CM

## 2015-07-21 DIAGNOSIS — E7800 Pure hypercholesterolemia, unspecified: Secondary | ICD-10-CM

## 2015-07-21 DIAGNOSIS — I872 Venous insufficiency (chronic) (peripheral): Secondary | ICD-10-CM | POA: Diagnosis not present

## 2015-07-21 DIAGNOSIS — I82513 Chronic embolism and thrombosis of femoral vein, bilateral: Secondary | ICD-10-CM | POA: Diagnosis not present

## 2015-07-21 DIAGNOSIS — Z7901 Long term (current) use of anticoagulants: Secondary | ICD-10-CM | POA: Diagnosis not present

## 2015-07-21 DIAGNOSIS — I10 Essential (primary) hypertension: Secondary | ICD-10-CM | POA: Diagnosis not present

## 2015-07-21 DIAGNOSIS — M545 Low back pain, unspecified: Secondary | ICD-10-CM

## 2015-07-21 DIAGNOSIS — M170 Bilateral primary osteoarthritis of knee: Secondary | ICD-10-CM | POA: Diagnosis not present

## 2015-07-21 LAB — CBC WITH DIFFERENTIAL/PLATELET
BASOS PCT: 0.4 % (ref 0.0–3.0)
Basophils Absolute: 0 10*3/uL (ref 0.0–0.1)
EOS ABS: 0.1 10*3/uL (ref 0.0–0.7)
Eosinophils Relative: 1.9 % (ref 0.0–5.0)
HCT: 35.3 % — ABNORMAL LOW (ref 36.0–46.0)
HEMOGLOBIN: 12.1 g/dL (ref 12.0–15.0)
LYMPHS ABS: 0.9 10*3/uL (ref 0.7–4.0)
Lymphocytes Relative: 27.9 % (ref 12.0–46.0)
MCHC: 34.2 g/dL (ref 30.0–36.0)
MCV: 100.7 fl — ABNORMAL HIGH (ref 78.0–100.0)
Monocytes Absolute: 0.3 10*3/uL (ref 0.1–1.0)
Monocytes Relative: 8.7 % (ref 3.0–12.0)
NEUTROS ABS: 2 10*3/uL (ref 1.4–7.7)
Neutrophils Relative %: 61.1 % (ref 43.0–77.0)
PLATELETS: 155 10*3/uL (ref 150.0–400.0)
RBC: 3.5 Mil/uL — ABNORMAL LOW (ref 3.87–5.11)
RDW: 13.1 % (ref 11.5–15.5)
WBC: 3.3 10*3/uL — AB (ref 4.0–10.5)

## 2015-07-21 LAB — BASIC METABOLIC PANEL
BUN: 32 mg/dL — ABNORMAL HIGH (ref 6–23)
CO2: 29 mEq/L (ref 19–32)
Calcium: 11 mg/dL — ABNORMAL HIGH (ref 8.4–10.5)
Chloride: 109 mEq/L (ref 96–112)
Creatinine, Ser: 1.38 mg/dL — ABNORMAL HIGH (ref 0.40–1.20)
GFR: 46.57 mL/min — AB (ref >60.00)
Glucose, Bld: 92 mg/dL (ref 70–99)
Potassium: 4.1 mEq/L (ref 3.5–5.1)
SODIUM: 144 meq/L (ref 135–145)

## 2015-07-21 LAB — LIPID PANEL
CHOL/HDL RATIO: 3
Cholesterol: 134 mg/dL (ref 0–200)
HDL: 44.8 mg/dL (ref >39.00)
LDL Cholesterol: 69 mg/dL (ref 0–99)
NONHDL: 89.05
Triglycerides: 98 mg/dL (ref 0.0–149.0)
VLDL: 19.6 mg/dL (ref 0.0–40.0)

## 2015-07-21 LAB — PROTIME-INR
INR: 2.4 ratio — ABNORMAL HIGH (ref 0.8–1.0)
Prothrombin Time: 26.1 s — ABNORMAL HIGH (ref 9.6–13.1)

## 2015-07-21 LAB — TSH: TSH: 0.51 u[IU]/mL (ref 0.35–4.50)

## 2015-07-21 MED ORDER — VASCULERA PO TABS: 1.0000 | ORAL_TABLET | Freq: Every day | ORAL | Status: AC

## 2015-07-21 MED ORDER — AZELASTINE HCL 0.1 % NA SOLN: 2.0000 | Freq: Two times a day (BID) | NASAL | Status: AC

## 2015-07-21 MED ORDER — HYDROCODONE-ACETAMINOPHEN 10-325 MG PO TABS: 1.0000 | ORAL_TABLET | Freq: Three times a day (TID) | ORAL | Status: DC | PRN

## 2015-07-21 MED ORDER — NEBIVOLOL HCL 10 MG PO TABS: 10.0000 mg | ORAL_TABLET | Freq: Every day | ORAL | Status: DC

## 2015-07-21 NOTE — Progress Notes (Signed)
Pre visit review using our clinic review tool, if applicable. No additional management support is needed unless otherwise documented below in the visit note. 

## 2015-07-21 NOTE — Patient Instructions (Signed)

## 2015-07-21 NOTE — Progress Notes (Signed)
Subjective:  Patient ID: Annette Mckay, female    DOB: 09/09/1927  Age: 81 y.o. MRN: [id number]  CC: Hypertension   HPI [name] presents for for follow-up but she also complains of dark spots, swelling, pain, itching, and irritation over both lower extremities. This is more prominent on the right than the left.  Outpatient Prescriptions Prior to Visit  Medication Sig Dispense Refill  . Calcium Carb-Cholecalciferol (CALCIUM 500 +D) 500-400 MG-UNIT TABS Take 1 tablet by mouth 3 (three) times daily.     . citalopram (CELEXA) 10 MG tablet Take 10 mg by mouth daily.    . cloNIDine (CATAPRES) 0.2 MG tablet TAKE ONE TABLET BY MOUTH 2 TIMES A DAY FOR BLOOD PRESSURE. 180 tablet 3  . losartan-hydrochlorothiazide (HYZAAR) 100-25 MG per tablet TAKE ONE TABLET BY MOUTH DAILY FOR HIGH BLOOD PRESSURE. 90 tablet 1  . omeprazole (PRILOSEC) 20 MG capsule TAKE ONE CAPSULE BY MOUTH EVERY DAY FOR INDIGESTION. 90 capsule 1  . potassium chloride SA (K-DUR,KLOR-CON) 20 MEQ tablet TAKE ONE TABLET BY MOUTH 2 TIMES A DAY. 180 tablet 3  . pravastatin (PRAVACHOL) 40 MG tablet TAKE ONE TABLET BY MOUTH AT BEDTIME FOR CHOLESTEROL. 90 tablet 3  . warfarin (COUMADIN) 5 MG tablet TAKE ONE TABLET BY MOUTH AS DIRECTED BY CLINIC. 35 tablet 3  . BYSTOLIC 5 MG tablet TAKE ONE TABLET BY MOUTH ONCE DAILY. 90 tablet 3  . HYDROcodone-acetaminophen (NORCO) 10-325 MG per tablet Take 1 tablet by mouth every 8 (eight) hours as needed. 90 tablet 0  . mometasone (NASONEX) 50 MCG/ACT nasal spray Place 4 sprays into the nose daily. (Patient not taking: Reported on 07/21/2015) 17 g 12   No facility-administered medications prior to visit.    ROS Review of Systems  Constitutional: Negative.  Negative for fever, chills, diaphoresis, appetite change and fatigue.  HENT: Positive for postnasal drip and rhinorrhea. Negative for congestion, nosebleeds, sinus pressure, sore throat, tinnitus, trouble swallowing and voice change.   Eyes:  Negative.   Respiratory: Negative.  Negative for cough, choking, chest tightness, shortness of breath and stridor.   Cardiovascular: Negative.  Negative for chest pain, palpitations and leg swelling.  Gastrointestinal: Negative.  Negative for nausea, vomiting, abdominal pain, diarrhea, constipation and blood in stool.  Endocrine: Negative.   Genitourinary: Negative.   Musculoskeletal: Positive for arthralgias. Negative for myalgias, back pain, joint swelling, gait problem, neck pain and neck stiffness.  Skin: Positive for color change and rash. Negative for pallor and wound.  Allergic/Immunologic: Negative.   Neurological: Negative.  Negative for dizziness, syncope, speech difficulty, light-headedness and numbness.  Hematological: Negative.  Negative for adenopathy. Does not bruise/bleed easily.  Psychiatric/Behavioral: Negative.     Objective:  BP 168/84 mmHg  Pulse 72  Temp(Src) 98.6 F (37 C) (Oral)  Resp 16  Ht  (1.6 m)  Wt 182 lb (82.555 kg)  BMI 32.25 kg/m2  SpO2 97%  BP Readings from Last 3 Encounters:  07/21/15 168/84  04/20/15 158/78  01/19/15 146/70    Wt Readings from Last 3 Encounters:  07/21/15 182 lb (82.555 kg)  04/20/15 184 lb 4 oz (83.575 kg)  01/19/15 188 lb (85.276 kg)    Physical Exam  Constitutional: She is oriented to person, place, and time. No distress.  HENT:  Nose: Mucosal edema and rhinorrhea present. No sinus tenderness. Right sinus exhibits no maxillary sinus tenderness and no frontal sinus tenderness. Left sinus exhibits no maxillary sinus tenderness and no frontal sinus tenderness.  Mouth/Throat: Oropharynx is clear and moist. No oropharyngeal exudate.  Eyes: Conjunctivae are normal. Right eye exhibits no discharge. Left eye exhibits no discharge. No scleral icterus.  Neck: Normal range of motion. Neck supple. No JVD present. No tracheal deviation present. No thyromegaly present.  Cardiovascular: Normal rate, regular rhythm, normal heart  sounds and intact distal pulses.  Exam reveals no gallop and no friction rub.   No murmur heard. Pulmonary/Chest: Effort normal and breath sounds normal. No stridor. No respiratory distress. She has no wheezes. She has no rales. She exhibits no tenderness.  Abdominal: Soft. Bowel sounds are normal. She exhibits no distension and no mass. There is no tenderness. There is no rebound and no guarding.  Musculoskeletal: Normal range of motion. She exhibits no edema or tenderness.  Lymphadenopathy:    She has no cervical adenopathy.  Neurological: She is oriented to person, place, and time.  Skin: Skin is warm and dry. Rash noted. She is not diaphoretic. No erythema. No pallor.  Over both lower extremities there are areas of venous insufficiency. On the right lower extremity there is trace edema, there are diffuse areas of brawny, hyperpigmented, induration and scaly patches. Over the left lower extremity there are areas of alligator skin. There is also trace edema in the left lower extremity.  Psychiatric: She has a normal mood and affect. Her behavior is normal. Judgment and thought content normal.  Vitals reviewed.   Lab Results  Component Value Date   WBC 3.3* 07/21/2015   HGB 12.1 07/21/2015   HCT 35.3* 07/21/2015   PLT 155.0 07/21/2015   GLUCOSE 92 07/21/2015   CHOL 134 07/21/2015   TRIG 98.0 07/21/2015   HDL 44.80 07/21/2015   LDLCALC 69 07/21/2015   ALT 19 04/20/2015   AST 28 04/20/2015   NA 144 07/21/2015   K 4.1 07/21/2015   CL 109 07/21/2015   CREATININE 1.38* 07/21/2015   BUN 32* 07/21/2015   CO2 29 07/21/2015   TSH 0.51 07/21/2015   INR 2.4* 07/21/2015   HGBA1C 5.3 03/12/2012    No results found.  Assessment & Plan:   Cordell was seen today for hypertension.  Diagnoses and all orders for this visit:  Primary osteoarthritis of both knees -     HYDROcodone-acetaminophen (NORCO) 10-325 MG per tablet; Take 1 tablet by mouth every 8 (eight) hours as needed.  Midline  low back pain without sciatica -     HYDROcodone-acetaminophen (NORCO) 10-325 MG per tablet; Take 1 tablet by mouth every 8 (eight) hours as needed.  DVT, femoral, chronic, bilateral- her INR is in the therapeutic window, will continue her current dose of Coumadin -     Protime-INR; Future  Essential hypertension- her blood pressure is adequately well controlled -     nebivolol (BYSTOLIC) 10 MG tablet; Take 1 tablet (10 mg total) by mouth daily. -     CBC with Differential/Platelet; Future  Kidney disease, chronic, stage III (GFR 30-59 ml/min)- her renal function is stable -     CBC with Differential/Platelet; Future -     Basic metabolic panel; Future  HYPERCHOLESTEROLEMIA -     Lipid panel; Future -     TSH; Future  Long term current use of anticoagulant therapy -     Protime-INR; Future  Chronic venous insufficiency- will start vasculera treat this -     Basic metabolic panel; Future -     Dietary Management Product (VASCULERA) TABS; Take 1 capsule by mouth daily.  Other  allergic rhinitis- we'll start an antihistamine nasal spray to treat this. -     azelastine (ASTELIN) 0.1 % nasal spray; Place 2 sprays into both nostrils 2 (two) times daily. Use in each nostril as directed  I have discontinued Ms. Sorci's BYSTOLIC. I am also having her start on nebivolol, VASCULERA, and azelastine. Additionally, I am having her maintain her Calcium Carb-Cholecalciferol, citalopram, mometasone, omeprazole, losartan-hydrochlorothiazide, warfarin, cloNIDine, potassium chloride SA, pravastatin, and HYDROcodone-acetaminophen.  Meds ordered this encounter  Medications  . HYDROcodone-acetaminophen (NORCO) 10-325 MG per tablet    Sig: Take 1 tablet by mouth every 8 (eight) hours as needed.    Dispense:  90 tablet    Refill:  0    Fill on or after 07/21/15  . nebivolol (BYSTOLIC) 10 MG tablet    Sig: Take 1 tablet (10 mg total) by mouth daily.    Dispense:  90 tablet    Refill:  3  . Dietary  Management Product (VASCULERA) TABS    Sig: Take 1 capsule by mouth daily.    Dispense:  30 tablet    Refill:  11  . azelastine (ASTELIN) 0.1 % nasal spray    Sig: Place 2 sprays into both nostrils 2 (two) times daily. Use in each nostril as directed    Dispense:  30 mL    Refill:  12     Follow-up: Return in about 2 months (around 09/20/2015).  Sanda Linger, MD

## 2015-08-02 ENCOUNTER — Other Ambulatory Visit: Payer: Self-pay | Admitting: Internal Medicine

## 2015-08-02 ENCOUNTER — Ambulatory Visit: Payer: Medicare Other

## 2015-09-16 ENCOUNTER — Other Ambulatory Visit: Payer: Self-pay | Admitting: Internal Medicine

## 2015-09-19 IMAGING — CT CT HEAD W/O CM
2 series · 16 of 30 positions shown, 20 images · non-contrast
Comparison: None.

CLINICAL DATA: Pain post trauma

EXAM:
CT HEAD WITHOUT CONTRAST
TECHNIQUE: Contiguous axial images were obtained from the base of the skull
through the vertex without intravenous contrast.

[Series 201: head w/o, idose (1) · axial · non-contrast · 0.41mm/px · z∈[+39,+159]mm · 13 of 30 slices shown, 17 images]
[im 3/30  brain]
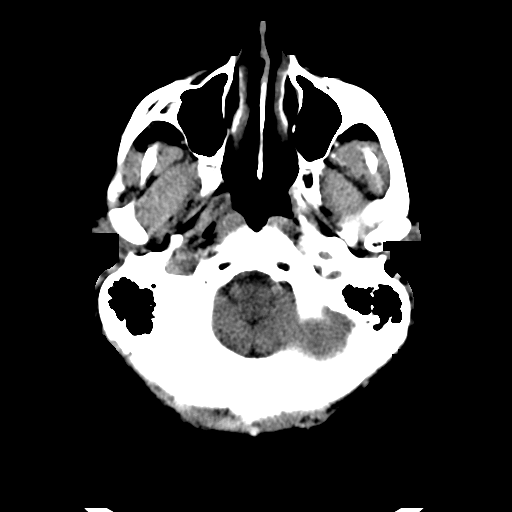
[im 3/30  bone]
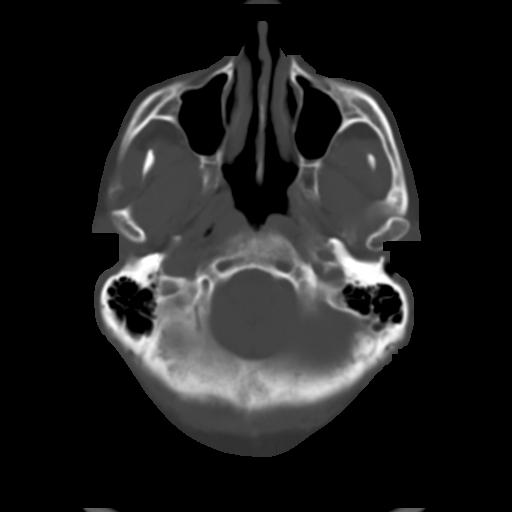
[im 5/30  brain]
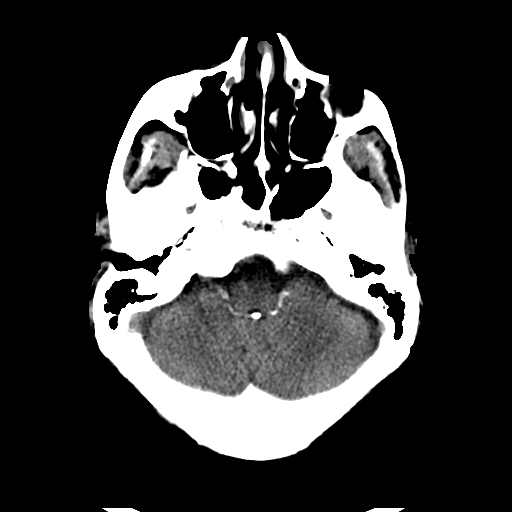
[im 7/30  brain]
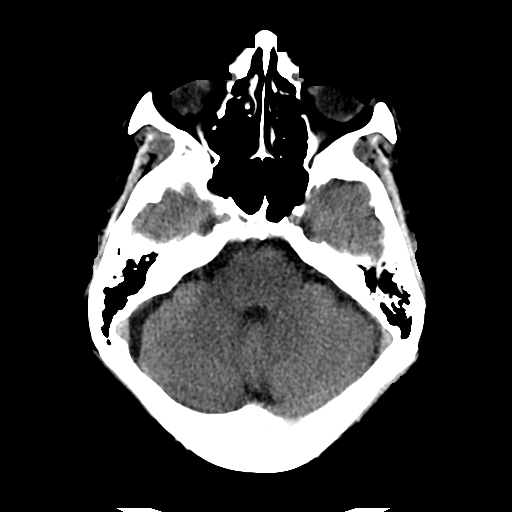
[im 9/30  brain]
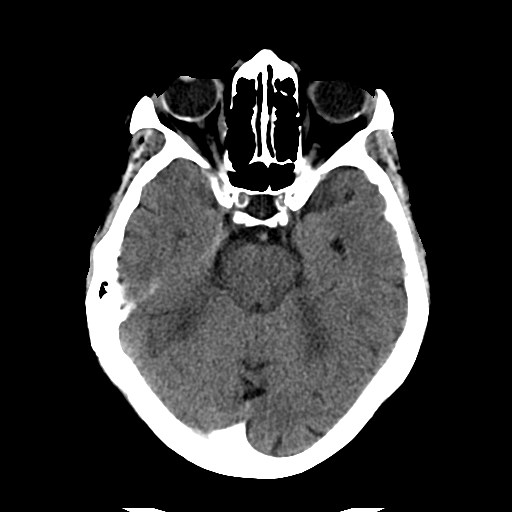
[im 11/30  brain]
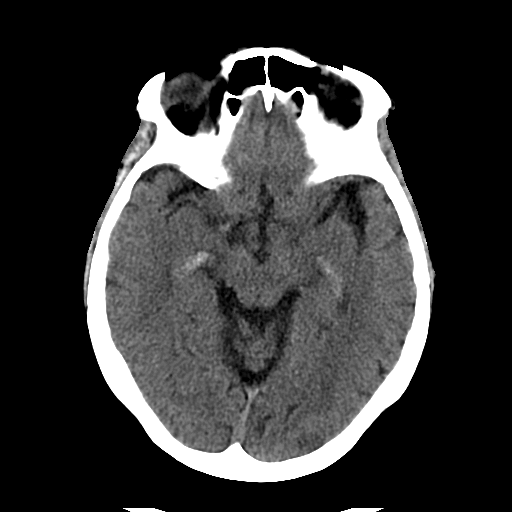
[im 11/30  bone]
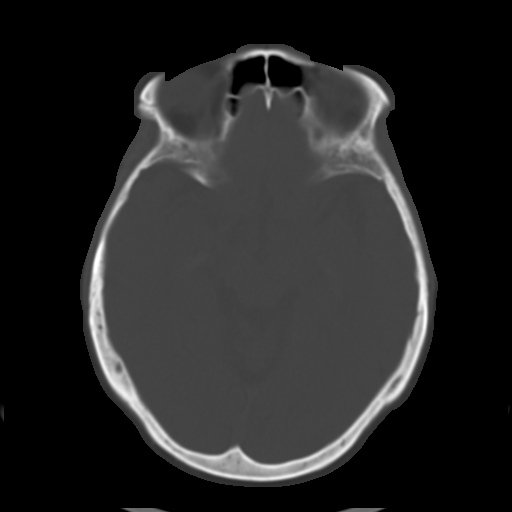
[im 13/30  brain]
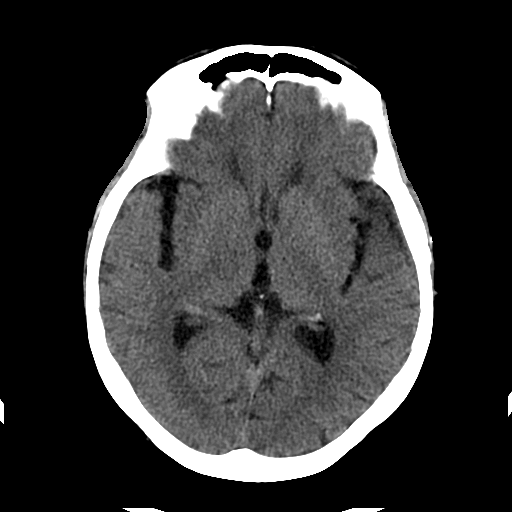
[im 15/30  brain]
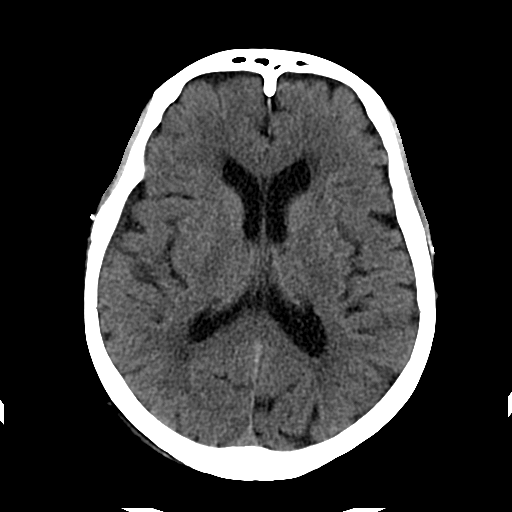
[im 17/30  brain]
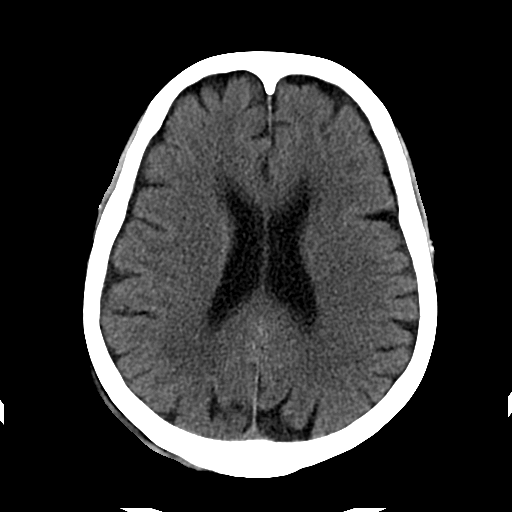
[im 19/30  brain]
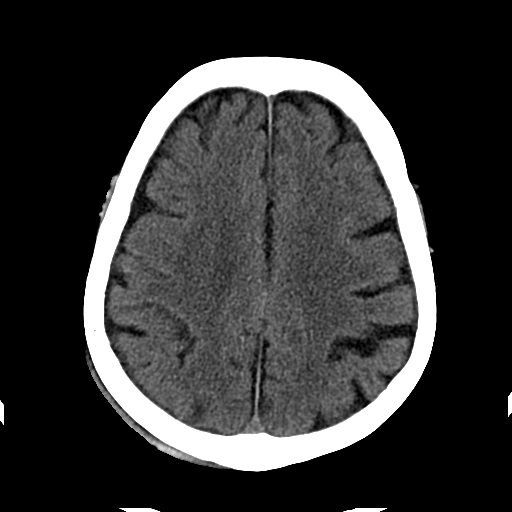
[im 19/30  bone]
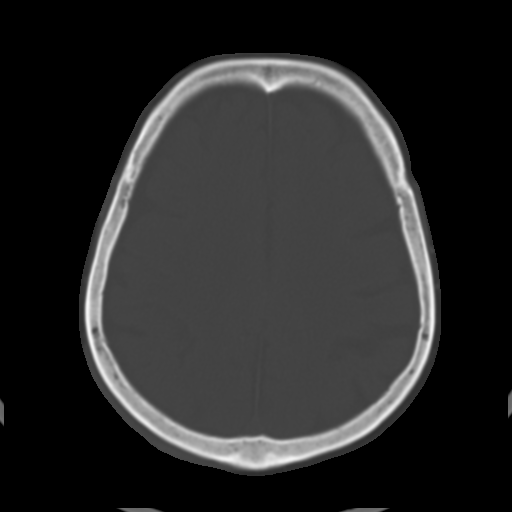
[im 21/30  brain]
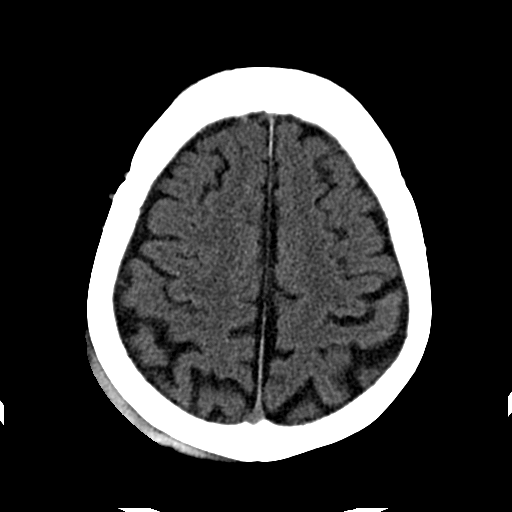
[im 23/30  brain]
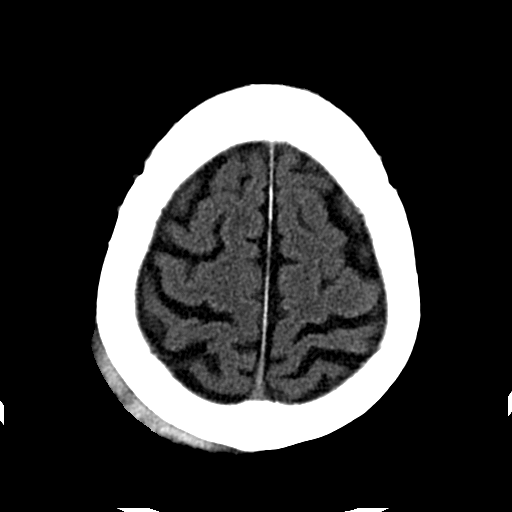
[im 25/30  brain]
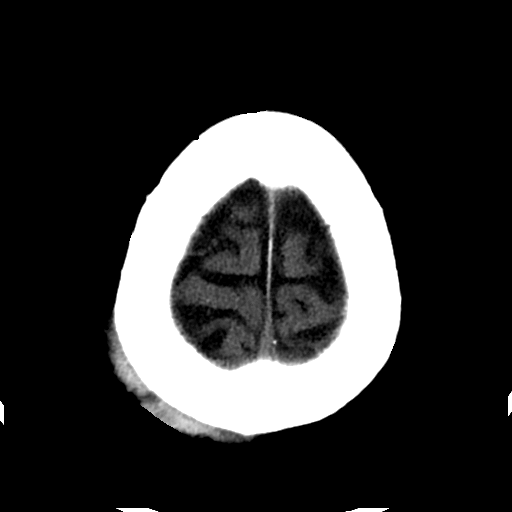
[im 27/30  brain]
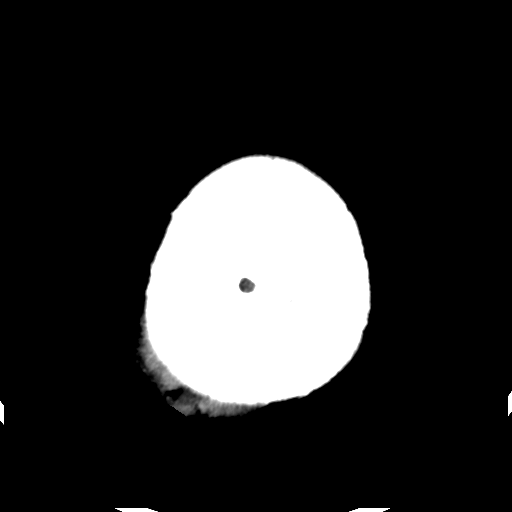
[im 27/30  bone]
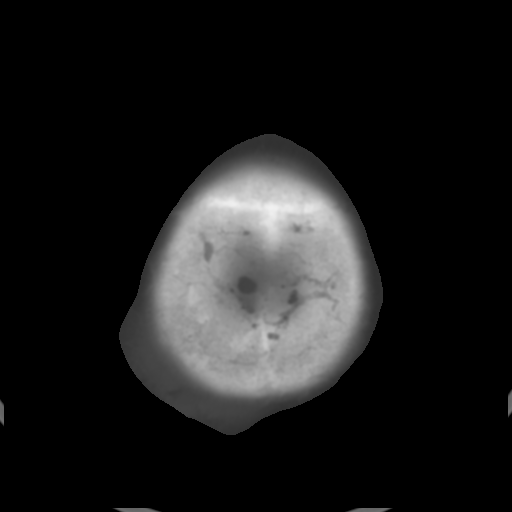

[Series 202: head w/o bone, idose (1) · axial · non-contrast · 0.41mm/px · z∈[+39,+79]mm · 3 of 30 slices shown]
[im 3/30  bone]
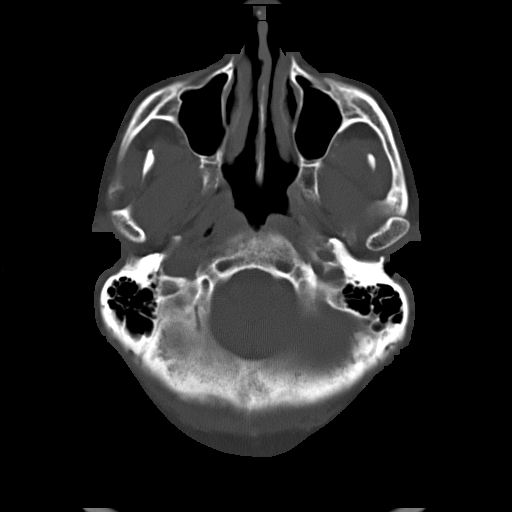
[im 7/30  bone]
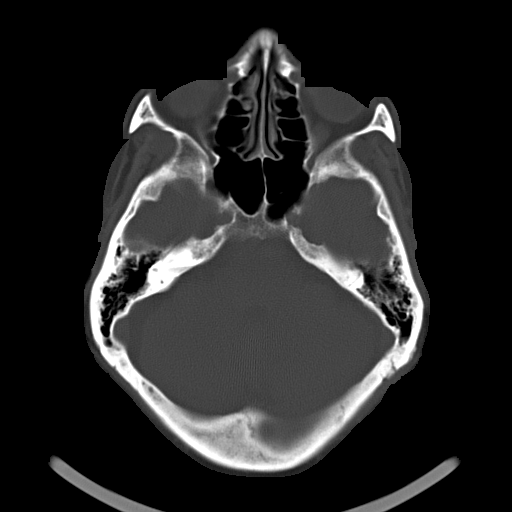
[im 11/30  bone]
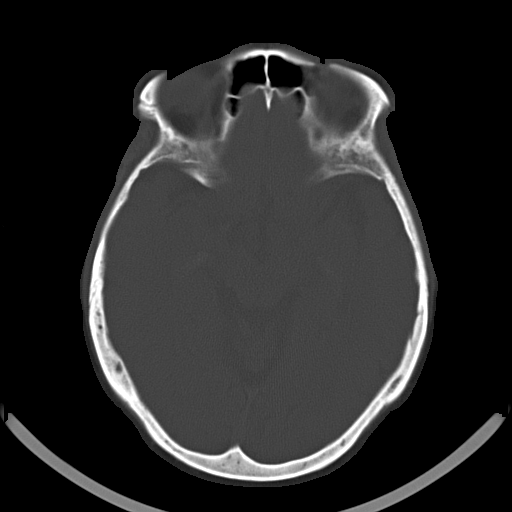

[16 of 30 positions shown; findings below may reference images not displayed]

FINDINGS: There is mild diffuse atrophy. There is no mass, hemorrhage,
extra-axial fluid collection, or midline shift. There is mild small
vessel disease in the centra semiovale bilaterally. There is no
demonstrable acute infarct. Bony calvarium appears intact.
Visualized mastoid air cells are clear. There is a right parietal
scalp hematoma.
IMPRESSION: Atrophy with mild small vessel disease. No intracranial hemorrhage,
mass, or extra-axial fluid. There is a right parietal scalp
hematoma.

## 2015-09-20 ENCOUNTER — Ambulatory Visit (INDEPENDENT_AMBULATORY_CARE_PROVIDER_SITE_OTHER): Payer: Medicare Other | Admitting: General Practice

## 2015-09-20 ENCOUNTER — Ambulatory Visit (INDEPENDENT_AMBULATORY_CARE_PROVIDER_SITE_OTHER): Payer: Medicare Other | Admitting: Internal Medicine

## 2015-09-20 VITALS — BP 152/86 | HR 57 | Temp 97.9°F | Resp 16 | Ht 63.0 in | Wt 183.0 lb

## 2015-09-20 DIAGNOSIS — M545 Low back pain, unspecified: Secondary | ICD-10-CM

## 2015-09-20 DIAGNOSIS — I10 Essential (primary) hypertension: Secondary | ICD-10-CM

## 2015-09-20 DIAGNOSIS — Z23 Encounter for immunization: Secondary | ICD-10-CM

## 2015-09-20 DIAGNOSIS — K219 Gastro-esophageal reflux disease without esophagitis: Secondary | ICD-10-CM

## 2015-09-20 DIAGNOSIS — Z5181 Encounter for therapeutic drug level monitoring: Secondary | ICD-10-CM

## 2015-09-20 DIAGNOSIS — M170 Bilateral primary osteoarthritis of knee: Secondary | ICD-10-CM | POA: Diagnosis not present

## 2015-09-20 DIAGNOSIS — G8929 Other chronic pain: Secondary | ICD-10-CM

## 2015-09-20 LAB — POCT INR: INR: 3.1

## 2015-09-20 MED ORDER — LOSARTAN POTASSIUM-HCTZ 100-25 MG PO TABS: 1.0000 | ORAL_TABLET | Freq: Every day | ORAL | Status: AC

## 2015-09-20 MED ORDER — HYDROCODONE-ACETAMINOPHEN 10-325 MG PO TABS: 1.0000 | ORAL_TABLET | Freq: Three times a day (TID) | ORAL | Status: DC | PRN

## 2015-09-20 MED ORDER — CLONIDINE HCL 0.2 MG PO TABS: 0.2000 mg | ORAL_TABLET | Freq: Two times a day (BID) | ORAL | Status: AC

## 2015-09-20 MED ORDER — OMEPRAZOLE 20 MG PO CPDR: 20.0000 mg | DELAYED_RELEASE_CAPSULE | Freq: Every day | ORAL | Status: AC

## 2015-09-20 NOTE — Progress Notes (Signed)
I have reviewed and agree with the plan. 

## 2015-09-20 NOTE — Patient Instructions (Signed)
Hypertension Hypertension, commonly called high blood pressure, is when the force of blood pumping through your arteries is too strong. Your arteries are the blood vessels that carry blood from your heart throughout your body. A blood pressure reading consists of a higher number over a lower number, such as 110/72. The higher number (systolic) is the pressure inside your arteries when your heart pumps. The lower number (diastolic) is the pressure inside your arteries when your heart relaxes. Ideally you want your blood pressure below 120/80. Hypertension forces your heart to work harder to pump blood. Your arteries may become narrow or stiff. Having untreated or uncontrolled hypertension can cause heart attack, stroke, kidney disease, and other problems. RISK FACTORS Some risk factors for high blood pressure are controllable. Others are not.  Risk factors you cannot control include:   Race. You may be at higher risk if you are African American.  Age. Risk increases with age.  Gender. Men are at higher risk than women before age 45 years. After age 65, women are at higher risk than men. Risk factors you can control include:  Not getting enough exercise or physical activity.  Being overweight.  Getting too much fat, sugar, calories, or salt in your diet.  Drinking too much alcohol. SIGNS AND SYMPTOMS Hypertension does not usually cause signs or symptoms. Extremely high blood pressure (hypertensive crisis) may cause headache, anxiety, shortness of breath, and nosebleed. DIAGNOSIS To check if you have hypertension, your health care provider will measure your blood pressure while you are seated, with your arm held at the level of your heart. It should be measured at least twice using the same arm. Certain conditions can cause a difference in blood pressure between your right and left arms. A blood pressure reading that is higher than normal on one occasion does not mean that you need treatment. If  it is not clear whether you have high blood pressure, you may be asked to return on a different day to have your blood pressure checked again. Or, you may be asked to monitor your blood pressure at home for 1 or more weeks. TREATMENT Treating high blood pressure includes making lifestyle changes and possibly taking medicine. Living a healthy lifestyle can help lower high blood pressure. You may need to change some of your habits. Lifestyle changes may include:  Following the DASH diet. This diet is high in fruits, vegetables, and whole grains. It is low in salt, red meat, and added sugars.  Keep your sodium intake below 2,300 mg per day.  Getting at least 30-45 minutes of aerobic exercise at least 4 times per week.  Losing weight if necessary.  Not smoking.  Limiting alcoholic beverages.  Learning ways to reduce stress. Your health care provider may prescribe medicine if lifestyle changes are not enough to get your blood pressure under control, and if one of the following is true:  You are 18-59 years of age and your systolic blood pressure is above 140.  You are 60 years of age or older, and your systolic blood pressure is above 150.  Your diastolic blood pressure is above 90.  You have diabetes, and your systolic blood pressure is over 140 or your diastolic blood pressure is over 90.  You have kidney disease and your blood pressure is above 140/90.  You have heart disease and your blood pressure is above 140/90. Your personal target blood pressure may vary depending on your medical conditions, your age, and other factors. HOME CARE INSTRUCTIONS    Have your blood pressure rechecked as directed by your health care provider.   Take medicines only as directed by your health care provider. Follow the directions carefully. Blood pressure medicines must be taken as prescribed. The medicine does not work as well when you skip doses. Skipping doses also puts you at risk for  problems.  Do not smoke.   Monitor your blood pressure at home as directed by your health care provider. SEEK MEDICAL CARE IF:   You think you are having a reaction to medicines taken.  You have recurrent headaches or feel dizzy.  You have swelling in your ankles.  You have trouble with your vision. SEEK IMMEDIATE MEDICAL CARE IF:  You develop a severe headache or confusion.  You have unusual weakness, numbness, or feel faint.  You have severe chest or abdominal pain.  You vomit repeatedly.  You have trouble breathing. MAKE SURE YOU:   Understand these instructions.  Will watch your condition.  Will get help right away if you are not doing well or get worse.   This information is not intended to replace advice given to you by your health care provider. Make sure you discuss any questions you have with your health care provider.   Document Released: 11/19/2005 Document Revised: 04/05/2015 Document Reviewed: 09/11/2013 [employer] Interactive Patient Education 2016 [employer]  

## 2015-09-20 NOTE — Progress Notes (Signed)
Subjective:  Patient ID: Annette Mckay, female    DOB: Sep 01, 1927  Age: 81 y.o. MRN: [id number]  CC: Osteoarthritis and Hypertension   HPI  Annette Mckay presents for f/up, she is please to report that her legs feel much better since she started vasculera with less pain, swelling, rash, itching. She offers no new complaints today.  Outpatient Prescriptions Prior to Visit  Medication Sig Dispense Refill  . azelastine (ASTELIN) 0.1 % nasal spray Place 2 sprays into both nostrils 2 (two) times daily. Use in each nostril as directed 30 mL 12  . Calcium Carb-Cholecalciferol (CALCIUM 500 +D) 500-400 MG-UNIT TABS Take 1 tablet by mouth 3 (three) times daily.     . citalopram (CELEXA) 10 MG tablet Take 10 mg by mouth daily.    . Dietary Management Product (VASCULERA) TABS Take 1 capsule by mouth daily. 30 tablet 11  . mometasone (NASONEX) 50 MCG/ACT nasal spray Place 4 sprays into the nose daily. 17 g 12  . nebivolol (BYSTOLIC) 10 MG tablet Take 1 tablet (10 mg total) by mouth daily. 90 tablet 3  . potassium chloride SA (K-DUR,KLOR-CON) 20 MEQ tablet TAKE ONE TABLET BY MOUTH 2 TIMES A DAY. 180 tablet 3  . pravastatin (PRAVACHOL) 40 MG tablet TAKE ONE TABLET BY MOUTH AT BEDTIME FOR CHOLESTEROL. 90 tablet 3  . warfarin (COUMADIN) 5 MG tablet TAKE ONE TABLET BY MOUTH AS DIRECTED BY CLINIC. 35 tablet 3  . cloNIDine (CATAPRES) 0.2 MG tablet TAKE ONE TABLET BY MOUTH 2 TIMES A DAY FOR BLOOD PRESSURE. 180 tablet 3  . HYDROcodone-acetaminophen (NORCO) 10-325 MG per tablet Take 1 tablet by mouth every 8 (eight) hours as needed. 90 tablet 0  . losartan-hydrochlorothiazide (HYZAAR) 100-25 MG per tablet TAKE ONE TABLET BY MOUTH DAILY FOR HIGH BLOOD PRESSURE. 90 tablet 1  . omeprazole (PRILOSEC) 20 MG capsule TAKE ONE CAPSULE BY MOUTH EVERY DAY FOR INDIGESTION. 90 capsule 1   No facility-administered medications prior to visit.    ROS Review of Systems  Constitutional: Negative.  Negative for fever,  chills, diaphoresis, appetite change and fatigue.  HENT: Negative.   Eyes: Negative.   Respiratory: Negative.  Negative for cough, choking, chest tightness, shortness of breath and stridor.   Cardiovascular: Negative.  Negative for chest pain, palpitations and leg swelling.  Gastrointestinal: Negative.  Negative for nausea, vomiting, abdominal pain, diarrhea, constipation and blood in stool.  Endocrine: Negative.   Genitourinary: Negative.   Musculoskeletal: Positive for back pain and arthralgias. Negative for myalgias and joint swelling.  Skin: Negative.  Negative for color change and rash.  Allergic/Immunologic: Negative.   Neurological: Negative.   Hematological: Negative.  Negative for adenopathy. Does not bruise/bleed easily.  Psychiatric/Behavioral: Negative.  Negative for sleep disturbance and agitation. The patient is not nervous/anxious and is not hyperactive.     Objective:  BP 152/86 mmHg  Pulse 57  Temp(Src) 97.9 F (36.6 C) (Oral)  Resp 16  Ht  (1.6 m)  Wt 183 lb (83.008 kg)  BMI 32.43 kg/m2  SpO2 97%  BP Readings from Last 3 Encounters:  09/20/15 152/86  07/21/15 168/84  04/20/15 158/78    Wt Readings from Last 3 Encounters:  09/20/15 183 lb (83.008 kg)  07/21/15 182 lb (82.555 kg)  04/20/15 184 lb 4 oz (83.575 kg)    Physical Exam  Constitutional: She is oriented to person, place, and time. No distress.  HENT:  Mouth/Throat: Oropharynx is clear and moist. No oropharyngeal exudate.  Eyes: Conjunctivae are normal. Right eye exhibits no discharge. Left eye exhibits no discharge. No scleral icterus.  Neck: Normal range of motion. Neck supple. No JVD present. No tracheal deviation present. No thyromegaly present.  Cardiovascular: Normal rate, regular rhythm, normal heart sounds and intact distal pulses.  Exam reveals no gallop and no friction rub.   No murmur heard. Pulmonary/Chest: Effort normal and breath sounds normal. No stridor. No respiratory  distress. She has no wheezes. She has no rales. She exhibits no tenderness.  Abdominal: Soft. Bowel sounds are normal. She exhibits no distension and no mass. There is no tenderness. There is no rebound and no guarding.  Musculoskeletal: Normal range of motion. She exhibits edema (trace edema in BLE). She exhibits no tenderness.  Lymphadenopathy:    She has no cervical adenopathy.  Neurological: She is oriented to person, place, and time.  Skin: Skin is warm and dry. No rash noted. She is not diaphoretic. No erythema. No pallor.  Vitals reviewed.   Lab Results  Component Value Date   WBC 3.3* 07/21/2015   HGB 12.1 07/21/2015   HCT 35.3* 07/21/2015   PLT 155.0 07/21/2015   GLUCOSE 92 07/21/2015   CHOL 134 07/21/2015   TRIG 98.0 07/21/2015   HDL 44.80 07/21/2015   LDLCALC 69 07/21/2015   ALT 19 04/20/2015   AST 28 04/20/2015   NA 144 07/21/2015   K 4.1 07/21/2015   CL 109 07/21/2015   CREATININE 1.38* 07/21/2015   BUN 32* 07/21/2015   CO2 29 07/21/2015   TSH 0.51 07/21/2015   INR 3.1 09/20/2015   HGBA1C 5.3 03/12/2012    No results found.  Assessment & Plan:   Annette Mckay was seen today for osteoarthritis and hypertension.  Diagnoses and all orders for this visit:  Primary osteoarthritis of both knees -     Discontinue: HYDROcodone-acetaminophen (NORCO) 10-325 MG tablet; Take 1 tablet by mouth every 8 (eight) hours as needed. -     Discontinue: HYDROcodone-acetaminophen (NORCO) 10-325 MG tablet; Take 1 tablet by mouth every 8 (eight) hours as needed. -     HYDROcodone-acetaminophen (NORCO) 10-325 MG tablet; Take 1 tablet by mouth every 8 (eight) hours as needed.  Need for influenza vaccination -     Flu vaccine HIGH DOSE PF  Chronic low back pain without sciatica, unspecified back pain laterality  Midline low back pain without sciatica -     Discontinue: HYDROcodone-acetaminophen (NORCO) 10-325 MG tablet; Take 1 tablet by mouth every 8 (eight) hours as needed. -      Discontinue: HYDROcodone-acetaminophen (NORCO) 10-325 MG tablet; Take 1 tablet by mouth every 8 (eight) hours as needed. -     HYDROcodone-acetaminophen (NORCO) 10-325 MG tablet; Take 1 tablet by mouth every 8 (eight) hours as needed.  Essential hypertension- her BP is adequately well controlled -     losartan-hydrochlorothiazide (HYZAAR) 100-25 MG tablet; Take 1 tablet by mouth daily. -     cloNIDine (CATAPRES) 0.2 MG tablet; Take 1 tablet (0.2 mg total) by mouth 2 (two) times daily.  Gastroesophageal reflux disease without esophagitis -     omeprazole (PRILOSEC) 20 MG capsule; Take 1 capsule (20 mg total) by mouth daily.  Other orders -     Cancel: cloNIDine (CATAPRES) 0.2 MG tablet;  -     Cancel: losartan-hydrochlorothiazide (HYZAAR) 100-25 MG tablet;  -     Cancel: omeprazole (PRILOSEC) 20 MG capsule; TAKE ONE CAPSULE BY MOUTH EVERY DAY FOR INDIGESTION.  I have changed Ms.  Faulds's losartan-hydrochlorothiazide, omeprazole, and cloNIDine. I am also having her maintain her Calcium Carb-Cholecalciferol, citalopram, mometasone, potassium chloride SA, pravastatin, nebivolol, VASCULERA, azelastine, warfarin, and HYDROcodone-acetaminophen.  Meds ordered this encounter  Medications  . losartan-hydrochlorothiazide (HYZAAR) 100-25 MG tablet    Sig: Take 1 tablet by mouth daily.    Dispense:  90 tablet    Refill:  3  . omeprazole (PRILOSEC) 20 MG capsule    Sig: Take 1 capsule (20 mg total) by mouth daily.    Dispense:  90 capsule    Refill:  3  . cloNIDine (CATAPRES) 0.2 MG tablet    Sig: Take 1 tablet (0.2 mg total) by mouth 2 (two) times daily.    Dispense:  180 tablet    Refill:  3  . DISCONTD: HYDROcodone-acetaminophen (NORCO) 10-325 MG tablet    Sig: Take 1 tablet by mouth every 8 (eight) hours as needed.    Dispense:  90 tablet    Refill:  0    Fill on or after 09/20/15  . DISCONTD: HYDROcodone-acetaminophen (NORCO) 10-325 MG tablet    Sig: Take 1 tablet by mouth every 8 (eight)  hours as needed.    Dispense:  90 tablet    Refill:  0    Fill on or after 10/21/15  . HYDROcodone-acetaminophen (NORCO) 10-325 MG tablet    Sig: Take 1 tablet by mouth every 8 (eight) hours as needed.    Dispense:  90 tablet    Refill:  0    Fill on or after 11/20/15     Follow-up: Return in about 3 months (around 12/21/2015).  Sanda Linger, MD

## 2015-09-20 NOTE — Progress Notes (Signed)
Pre visit review using our clinic review tool, if applicable. No additional management support is needed unless otherwise documented below in the visit note. 

## 2015-09-21 ENCOUNTER — Encounter: Payer: Self-pay | Admitting: Internal Medicine

## 2015-10-18 ENCOUNTER — Ambulatory Visit: Payer: Medicare Other

## 2015-10-25 ENCOUNTER — Ambulatory Visit (INDEPENDENT_AMBULATORY_CARE_PROVIDER_SITE_OTHER): Payer: Medicare Other | Admitting: General Practice

## 2015-10-25 DIAGNOSIS — I82409 Acute embolism and thrombosis of unspecified deep veins of unspecified lower extremity: Secondary | ICD-10-CM | POA: Diagnosis not present

## 2015-10-25 DIAGNOSIS — Z5181 Encounter for therapeutic drug level monitoring: Secondary | ICD-10-CM | POA: Diagnosis not present

## 2015-10-25 LAB — POCT INR: INR: 3.2

## 2015-10-25 NOTE — Progress Notes (Signed)
I have reviewed and agree with the plan. 

## 2015-10-25 NOTE — Progress Notes (Signed)
Pre visit review using our clinic review tool, if applicable. No additional management support is needed unless otherwise documented below in the visit note. 

## 2015-11-22 ENCOUNTER — Ambulatory Visit (INDEPENDENT_AMBULATORY_CARE_PROVIDER_SITE_OTHER): Payer: Medicare Other | Admitting: General Practice

## 2015-11-22 DIAGNOSIS — I82409 Acute embolism and thrombosis of unspecified deep veins of unspecified lower extremity: Secondary | ICD-10-CM | POA: Diagnosis not present

## 2015-11-22 DIAGNOSIS — Z5181 Encounter for therapeutic drug level monitoring: Secondary | ICD-10-CM | POA: Diagnosis not present

## 2015-11-22 LAB — POCT INR: INR: 3.6

## 2015-11-22 NOTE — Progress Notes (Signed)
I have reviewed and agree with the plan. 

## 2015-11-22 NOTE — Progress Notes (Signed)
Pre visit review using our clinic review tool, if applicable. No additional management support is needed unless otherwise documented below in the visit note. 

## 2015-12-20 ENCOUNTER — Ambulatory Visit: Payer: Medicare Other

## 2015-12-21 ENCOUNTER — Ambulatory Visit: Payer: Medicare Other | Admitting: Internal Medicine

## 2016-01-18 ENCOUNTER — Other Ambulatory Visit: Payer: Self-pay | Admitting: Internal Medicine

## 2016-01-18 ENCOUNTER — Other Ambulatory Visit (INDEPENDENT_AMBULATORY_CARE_PROVIDER_SITE_OTHER): Payer: Medicare Other

## 2016-01-18 ENCOUNTER — Telehealth: Payer: Self-pay

## 2016-01-18 DIAGNOSIS — Z5181 Encounter for therapeutic drug level monitoring: Secondary | ICD-10-CM

## 2016-01-18 DIAGNOSIS — I82513 Chronic embolism and thrombosis of femoral vein, bilateral: Secondary | ICD-10-CM

## 2016-01-18 DIAGNOSIS — N183 Chronic kidney disease, stage 3 unspecified: Secondary | ICD-10-CM

## 2016-01-18 DIAGNOSIS — Z7901 Long term (current) use of anticoagulants: Secondary | ICD-10-CM | POA: Diagnosis not present

## 2016-01-18 DIAGNOSIS — I10 Essential (primary) hypertension: Secondary | ICD-10-CM

## 2016-01-18 MED ORDER — CARVEDILOL 6.25 MG PO TABS: 6.2500 mg | ORAL_TABLET | Freq: Two times a day (BID) | ORAL | Status: AC

## 2016-01-18 NOTE — Telephone Encounter (Signed)
changed

## 2016-01-18 NOTE — Telephone Encounter (Signed)
Pharmacy faxed stating Bystolic is no longer covered under pt's plan.  They are requesting alternative medication, thanks

## 2016-01-19 LAB — PROTIME-INR
INR: 3.1 ratio — AB (ref 0.8–1.0)
PROTHROMBIN TIME: 33.2 s — AB (ref 9.6–13.1)

## 2016-01-24 ENCOUNTER — Ambulatory Visit: Payer: Medicare Other | Admitting: Internal Medicine

## 2016-01-26 ENCOUNTER — Ambulatory Visit (INDEPENDENT_AMBULATORY_CARE_PROVIDER_SITE_OTHER): Payer: Medicare Other | Admitting: General Practice

## 2016-01-26 ENCOUNTER — Other Ambulatory Visit (INDEPENDENT_AMBULATORY_CARE_PROVIDER_SITE_OTHER): Payer: Medicare Other

## 2016-01-26 ENCOUNTER — Encounter: Payer: Self-pay | Admitting: Internal Medicine

## 2016-01-26 ENCOUNTER — Ambulatory Visit (INDEPENDENT_AMBULATORY_CARE_PROVIDER_SITE_OTHER): Payer: Medicare Other | Admitting: Internal Medicine

## 2016-01-26 VITALS — BP 146/86 | HR 62 | Temp 97.2°F | Resp 16 | Ht 63.0 in | Wt 188.0 lb

## 2016-01-26 DIAGNOSIS — I10 Essential (primary) hypertension: Secondary | ICD-10-CM | POA: Diagnosis not present

## 2016-01-26 DIAGNOSIS — D61818 Other pancytopenia: Secondary | ICD-10-CM

## 2016-01-26 DIAGNOSIS — M170 Bilateral primary osteoarthritis of knee: Secondary | ICD-10-CM | POA: Diagnosis not present

## 2016-01-26 DIAGNOSIS — N183 Chronic kidney disease, stage 3 unspecified: Secondary | ICD-10-CM

## 2016-01-26 DIAGNOSIS — M545 Low back pain, unspecified: Secondary | ICD-10-CM

## 2016-01-26 DIAGNOSIS — I82409 Acute embolism and thrombosis of unspecified deep veins of unspecified lower extremity: Secondary | ICD-10-CM

## 2016-01-26 DIAGNOSIS — Z5181 Encounter for therapeutic drug level monitoring: Secondary | ICD-10-CM | POA: Diagnosis not present

## 2016-01-26 LAB — CBC WITH DIFFERENTIAL/PLATELET
BASOS ABS: 0 10*3/uL (ref 0.0–0.1)
BASOS PCT: 0.3 % (ref 0.0–3.0)
EOS ABS: 0.1 10*3/uL (ref 0.0–0.7)
Eosinophils Relative: 2.4 % (ref 0.0–5.0)
HEMATOCRIT: 35 % — AB (ref 36.0–46.0)
HEMOGLOBIN: 11.8 g/dL — AB (ref 12.0–15.0)
LYMPHS PCT: 27.9 % (ref 12.0–46.0)
Lymphs Abs: 1 10*3/uL (ref 0.7–4.0)
MCHC: 33.7 g/dL (ref 30.0–36.0)
MCV: 99.8 fl (ref 78.0–100.0)
MONOS PCT: 10.2 % (ref 3.0–12.0)
Monocytes Absolute: 0.4 10*3/uL (ref 0.1–1.0)
NEUTROS ABS: 2.2 10*3/uL (ref 1.4–7.7)
Neutrophils Relative %: 59.2 % (ref 43.0–77.0)
PLATELETS: 153 10*3/uL (ref 150.0–400.0)
RBC: 3.51 Mil/uL — ABNORMAL LOW (ref 3.87–5.11)
RDW: 13.5 % (ref 11.5–15.5)
WBC: 3.7 10*3/uL — AB (ref 4.0–10.5)

## 2016-01-26 LAB — BASIC METABOLIC PANEL
BUN: 35 mg/dL — AB (ref 6–23)
CHLORIDE: 104 meq/L (ref 96–112)
CO2: 29 mEq/L (ref 19–32)
CREATININE: 1.33 mg/dL — AB (ref 0.40–1.20)
Calcium: 10.3 mg/dL (ref 8.4–10.5)
GFR: 48.54 mL/min — ABNORMAL LOW (ref >60.00)
GLUCOSE: 91 mg/dL (ref 70–99)
POTASSIUM: 4.2 meq/L (ref 3.5–5.1)
Sodium: 139 mEq/L (ref 135–145)

## 2016-01-26 LAB — POCT INR: INR: 2.1

## 2016-01-26 MED ORDER — HYDROCODONE-ACETAMINOPHEN 10-325 MG PO TABS: 1.0000 | ORAL_TABLET | Freq: Three times a day (TID) | ORAL | Status: DC | PRN

## 2016-01-26 MED ORDER — HYDROCODONE-ACETAMINOPHEN 10-325 MG PO TABS: 1.0000 | ORAL_TABLET | Freq: Three times a day (TID) | ORAL | Status: AC | PRN

## 2016-01-26 NOTE — Progress Notes (Signed)
Pre visit review using our clinic review tool, if applicable. No additional management support is needed unless otherwise documented below in the visit note. 

## 2016-01-26 NOTE — Patient Instructions (Signed)
Hypertension Hypertension, commonly called high blood pressure, is when the force of blood pumping through your arteries is too strong. Your arteries are the blood vessels that carry blood from your heart throughout your body. A blood pressure reading consists of a higher number over a lower number, such as 110/72. The higher number (systolic) is the pressure inside your arteries when your heart pumps. The lower number (diastolic) is the pressure inside your arteries when your heart relaxes. Ideally you want your blood pressure below 120/80. Hypertension forces your heart to work harder to pump blood. Your arteries may become narrow or stiff. Having untreated or uncontrolled hypertension can cause heart attack, stroke, kidney disease, and other problems. RISK FACTORS Some risk factors for high blood pressure are controllable. Others are not.  Risk factors you cannot control include:   Race. You may be at higher risk if you are African American.  Age. Risk increases with age.  Gender. Men are at higher risk than women before age 45 years. After age 65, women are at higher risk than men. Risk factors you can control include:  Not getting enough exercise or physical activity.  Being overweight.  Getting too much fat, sugar, calories, or salt in your diet.  Drinking too much alcohol. SIGNS AND SYMPTOMS Hypertension does not usually cause signs or symptoms. Extremely high blood pressure (hypertensive crisis) may cause headache, anxiety, shortness of breath, and nosebleed. DIAGNOSIS To check if you have hypertension, your health care provider will measure your blood pressure while you are seated, with your arm held at the level of your heart. It should be measured at least twice using the same arm. Certain conditions can cause a difference in blood pressure between your right and left arms. A blood pressure reading that is higher than normal on one occasion does not mean that you need treatment. If  it is not clear whether you have high blood pressure, you may be asked to return on a different day to have your blood pressure checked again. Or, you may be asked to monitor your blood pressure at home for 1 or more weeks. TREATMENT Treating high blood pressure includes making lifestyle changes and possibly taking medicine. Living a healthy lifestyle can help lower high blood pressure. You may need to change some of your habits. Lifestyle changes may include:  Following the DASH diet. This diet is high in fruits, vegetables, and whole grains. It is low in salt, red meat, and added sugars.  Keep your sodium intake below 2,300 mg per day.  Getting at least 30-45 minutes of aerobic exercise at least 4 times per week.  Losing weight if necessary.  Not smoking.  Limiting alcoholic beverages.  Learning ways to reduce stress. Your health care provider may prescribe medicine if lifestyle changes are not enough to get your blood pressure under control, and if one of the following is true:  You are 18-59 years of age and your systolic blood pressure is above 140.  You are 60 years of age or older, and your systolic blood pressure is above 150.  Your diastolic blood pressure is above 90.  You have diabetes, and your systolic blood pressure is over 140 or your diastolic blood pressure is over 90.  You have kidney disease and your blood pressure is above 140/90.  You have heart disease and your blood pressure is above 140/90. Your personal target blood pressure may vary depending on your medical conditions, your age, and other factors. HOME CARE INSTRUCTIONS    Have your blood pressure rechecked as directed by your health care provider.   Take medicines only as directed by your health care provider. Follow the directions carefully. Blood pressure medicines must be taken as prescribed. The medicine does not work as well when you skip doses. Skipping doses also puts you at risk for  problems.  Do not smoke.   Monitor your blood pressure at home as directed by your health care provider. SEEK MEDICAL CARE IF:   You think you are having a reaction to medicines taken.  You have recurrent headaches or feel dizzy.  You have swelling in your ankles.  You have trouble with your vision. SEEK IMMEDIATE MEDICAL CARE IF:  You develop a severe headache or confusion.  You have unusual weakness, numbness, or feel faint.  You have severe chest or abdominal pain.  You vomit repeatedly.  You have trouble breathing. MAKE SURE YOU:   Understand these instructions.  Will watch your condition.  Will get help right away if you are not doing well or get worse.   This information is not intended to replace advice given to you by your health care provider. Make sure you discuss any questions you have with your health care provider.   Document Released: 11/19/2005 Document Revised: 04/05/2015 Document Reviewed: 09/11/2013 [employer] Interactive Patient Education 2016 [employer]  

## 2016-01-26 NOTE — Progress Notes (Signed)
Subjective:  Patient ID: Annette Mckay, female    DOB: Jul 26, 1927  Age: 82 y.o. MRN: [id number]  CC: Anemia; Hypertension; and Osteoarthritis   HPI Annette Mckay presents for a blood pressure check and follow-up on chronic pancytopenia. She complains of arthritis pain in both shoulders and both knees. She is with her granddaughter today and she does not think she has been receiving the tramadol that was prescribed in December. She tells me that her blood pressure at home has been well controlled though she's not sure if she is taking the carvedilol but she is sure that she is taking clonidine, losartan, and hydrochlorothiazide. She has had no symptoms of high blood pressure such as headache, blurred vision, chest pain, shortness of breath, dizziness, or edema.  Outpatient Prescriptions Prior to Visit  Medication Sig Dispense Refill  . azelastine (ASTELIN) 0.1 % nasal spray Place 2 sprays into both nostrils 2 (two) times daily. Use in each nostril as directed 30 mL 12  . Calcium Carb-Cholecalciferol (CALCIUM 500 +D) 500-400 MG-UNIT TABS Take 1 tablet by mouth 3 (three) times daily.     . carvedilol (COREG) 6.25 MG tablet Take 1 tablet (6.25 mg total) by mouth 2 (two) times daily with a meal. 180 tablet 1  . citalopram (CELEXA) 10 MG tablet Take 10 mg by mouth daily.    . cloNIDine (CATAPRES) 0.2 MG tablet Take 1 tablet (0.2 mg total) by mouth 2 (two) times daily. 180 tablet 3  . Dietary Management Product (VASCULERA) TABS Take 1 capsule by mouth daily. 30 tablet 11  . losartan-hydrochlorothiazide (HYZAAR) 100-25 MG tablet Take 1 tablet by mouth daily. 90 tablet 3  . mometasone (NASONEX) 50 MCG/ACT nasal spray Place 4 sprays into the nose daily. 17 g 12  . omeprazole (PRILOSEC) 20 MG capsule Take 1 capsule (20 mg total) by mouth daily. 90 capsule 3  . potassium chloride SA (K-DUR,KLOR-CON) 20 MEQ tablet TAKE ONE TABLET BY MOUTH 2 TIMES A DAY. 180 tablet 3  . pravastatin (PRAVACHOL) 40 MG tablet  TAKE ONE TABLET BY MOUTH AT BEDTIME FOR CHOLESTEROL. 90 tablet 3  . warfarin (COUMADIN) 5 MG tablet TAKE ONE TABLET BY MOUTH AS DIRECTED BY CLINIC. 35 tablet 3  . HYDROcodone-acetaminophen (NORCO) 10-325 MG tablet Take 1 tablet by mouth every 8 (eight) hours as needed. 90 tablet 0   No facility-administered medications prior to visit.    ROS Review of Systems  Constitutional: Negative.  Negative for fever, chills, diaphoresis, activity change, appetite change, fatigue and unexpected weight change.  HENT: Negative.  Negative for facial swelling, nosebleeds, postnasal drip, sinus pressure, sore throat, trouble swallowing and voice change.   Eyes: Negative.   Respiratory: Negative.  Negative for cough, choking, chest tightness, shortness of breath and stridor.   Cardiovascular: Negative.  Negative for chest pain, palpitations and leg swelling.  Gastrointestinal: Negative.  Negative for nausea, vomiting, abdominal pain, diarrhea, constipation and blood in stool.  Endocrine: Negative.   Genitourinary: Negative.   Musculoskeletal: Positive for arthralgias. Negative for myalgias, back pain, joint swelling and neck pain.  Skin: Negative.  Negative for color change, pallor and rash.  Allergic/Immunologic: Negative.   Neurological: Negative.  Negative for dizziness, tremors, syncope, light-headedness, numbness and headaches.  Hematological: Negative.  Negative for adenopathy. Does not bruise/bleed easily.  Psychiatric/Behavioral: Negative.     Objective:  Ht 5\' 3"  (1.6 m)  BP Readings from Last 3 Encounters:  09/20/15 152/86  07/21/15 168/84  04/20/15 158/78  Wt Readings from Last 3 Encounters:  09/20/15 183 lb (83.008 kg)  07/21/15 182 lb (82.555 kg)  04/20/15 184 lb 4 oz (83.575 kg)    Physical Exam  Constitutional: She is oriented to person, place, and time. She appears well-developed and well-nourished. No distress.  HENT:  Head: Normocephalic and atraumatic.  Mouth/Throat:  Oropharynx is clear and moist. No oropharyngeal exudate.  Eyes: Conjunctivae are normal. Right eye exhibits no discharge. Left eye exhibits no discharge. No scleral icterus.  Neck: Normal range of motion. Neck supple. No JVD present. No tracheal deviation present. No thyromegaly present.  Cardiovascular: Normal rate, regular rhythm, normal heart sounds and intact distal pulses.  Exam reveals no gallop and no friction rub.   No murmur heard. Pulmonary/Chest: Effort normal and breath sounds normal. No stridor. No respiratory distress. She has no wheezes. She has no rales. She exhibits no tenderness.  Abdominal: Soft. Bowel sounds are normal. She exhibits no distension and no mass. There is no tenderness. There is no rebound and no guarding.  Musculoskeletal: Normal range of motion. She exhibits no edema or tenderness.  Lymphadenopathy:    She has no cervical adenopathy.  Neurological: She is oriented to person, place, and time.  Skin: Skin is warm and dry. No rash noted. She is not diaphoretic. No erythema. No pallor.    Lab Results  Component Value Date   WBC 3.7* 01/26/2016   HGB 11.8* 01/26/2016   HCT 35.0* 01/26/2016   PLT 153.0 01/26/2016   GLUCOSE 91 01/26/2016   CHOL 134 07/21/2015   TRIG 98.0 07/21/2015   HDL 44.80 07/21/2015   LDLCALC 69 07/21/2015   ALT 19 04/20/2015   AST 28 04/20/2015   NA 139 01/26/2016   K 4.2 01/26/2016   CL 104 01/26/2016   CREATININE 1.33* 01/26/2016   BUN 35* 01/26/2016   CO2 29 01/26/2016   TSH 0.51 07/21/2015   INR 2.1 01/26/2016   HGBA1C 5.3 03/12/2012    No results found.  Assessment & Plan:   Annette Mckay was seen today for anemia, hypertension and osteoarthritis.  Diagnoses and all orders for this visit:  Primary osteoarthritis of both knees -     Discontinue: HYDROcodone-acetaminophen (NORCO) 10-325 MG tablet; Take 1 tablet by mouth every 8 (eight) hours as needed. -     Ambulatory referral to Sports Medicine -     Discontinue:  HYDROcodone-acetaminophen (NORCO) 10-325 MG tablet; Take 1 tablet by mouth every 8 (eight) hours as needed. -     Discontinue: HYDROcodone-acetaminophen (NORCO) 10-325 MG tablet; Take 1 tablet by mouth every 8 (eight) hours as needed. -     HYDROcodone-acetaminophen (NORCO) 10-325 MG tablet; Take 1 tablet by mouth every 8 (eight) hours as needed.  Midline low back pain without sciatica -     Discontinue: HYDROcodone-acetaminophen (NORCO) 10-325 MG tablet; Take 1 tablet by mouth every 8 (eight) hours as needed. -     Discontinue: HYDROcodone-acetaminophen (NORCO) 10-325 MG tablet; Take 1 tablet by mouth every 8 (eight) hours as needed. -     Discontinue: HYDROcodone-acetaminophen (NORCO) 10-325 MG tablet; Take 1 tablet by mouth every 8 (eight) hours as needed. -     HYDROcodone-acetaminophen (NORCO) 10-325 MG tablet; Take 1 tablet by mouth every 8 (eight) hours as needed.  Essential hypertension- her blood pressure is well-controlled, electrolytes and renal function are stable. -     Basic metabolic panel; Future  Kidney disease, chronic, stage III (GFR 30-59 ml/min)- her renal function is  stable, she will avoid nephrotoxic agents, will treat her pain with out using a nonsteroidal, will continue to maintain good blood pressure control -     CBC with Differential/Platelet; Future -     Basic metabolic panel; Future  Pancytopenia (HCC)- this is a chronic, stable finding for her with no evidence of evolution into a lymphoproliferative disorder. -     CBC with Differential/Platelet; Future   I am having Ms. Palladino maintain her Calcium Carb-Cholecalciferol, citalopram, mometasone, potassium chloride SA, pravastatin, VASCULERA, azelastine, warfarin, losartan-hydrochlorothiazide, omeprazole, cloNIDine, carvedilol, and HYDROcodone-acetaminophen.  Meds ordered this encounter  Medications  . DISCONTD: HYDROcodone-acetaminophen (NORCO) 10-325 MG tablet    Sig: Take 1 tablet by mouth every 8 (eight)  hours as needed.    Dispense:  90 tablet    Refill:  0    Fill on or after 01/26/16  . DISCONTD: HYDROcodone-acetaminophen (NORCO) 10-325 MG tablet    Sig: Take 1 tablet by mouth every 8 (eight) hours as needed.    Dispense:  90 tablet    Refill:  0    Fill on or after 02/23/16  . DISCONTD: HYDROcodone-acetaminophen (NORCO) 10-325 MG tablet    Sig: Take 1 tablet by mouth every 8 (eight) hours as needed.    Dispense:  90 tablet    Refill:  0    Fill on or after 03/25/16  . HYDROcodone-acetaminophen (NORCO) 10-325 MG tablet    Sig: Take 1 tablet by mouth every 8 (eight) hours as needed.    Dispense:  90 tablet    Refill:  0    Fill on or after 04/24/16     Follow-up: Return in about 6 months (around 07/25/2016).  Sanda Linger, MD

## 2016-01-26 NOTE — Progress Notes (Signed)
I have reviewed and agree with the plan. 

## 2016-01-29 ENCOUNTER — Encounter: Payer: Self-pay | Admitting: Internal Medicine

## 2016-02-17 ENCOUNTER — Ambulatory Visit: Payer: Medicare Other

## 2016-02-29 ENCOUNTER — Encounter: Payer: Self-pay | Admitting: Family Medicine

## 2016-02-29 ENCOUNTER — Ambulatory Visit (INDEPENDENT_AMBULATORY_CARE_PROVIDER_SITE_OTHER): Payer: Medicare Other | Admitting: Family Medicine

## 2016-02-29 VITALS — BP 142/70 | HR 62 | Ht 63.0 in | Wt 184.0 lb

## 2016-02-29 DIAGNOSIS — M1711 Unilateral primary osteoarthritis, right knee: Secondary | ICD-10-CM | POA: Diagnosis not present

## 2016-02-29 NOTE — Progress Notes (Signed)
Pre visit review using our clinic review tool, if applicable. No additional management support is needed unless otherwise documented below in the visit note. 

## 2016-02-29 NOTE — Progress Notes (Signed)
[facility] [address] [place], [place] [address] Phone: [phone] Subjective:    I'm seeing this patient by the request  of:  Sanda Linger, MD   CC: Knee pain  Annette Mckay is a 82 y.o. female coming in with complaint of Right-sided knee pain. Patient discusses the pain as a dull throbbing aching sensation. Has seen primary care provider previously. Has been given an injection greater than 6 months ago she thinks. Patient states that it did help for a little bit but not severe. Patient is looking for possible different types of injections. She is been told about viscous supplementation. Patient states that the pain is unrelenting. Affects her walking. Sometimes pain at night. Denies any radiation of pain. Positive for instability. Rates the severity of 8 out of 10. Sometimes associated with swelling. No numbness. Nothing has seemed to be helpful. Has had this pain for multiple years.     Past Medical History  Diagnosis Date  . ACUTE VENOUS EMBO & THROMB SUP VEINS UPPER EXT 08/01/2010  . ANEMIA-NOS 11/09/2010  . ARTHRALGIA 08/01/2010  . BACK PAIN, LUMBAR 08/01/2010  . CONSTIPATION 08/01/2010  . DEPRESSION 08/01/2010  . DVT 08/08/2010  . ECZEMA, ATOPIC 08/01/2010  . Edema 08/08/2010  . ESOPHAGEAL STRICTURE 08/01/2010  . GERD 08/01/2010  . HYPERCHOLESTEROLEMIA 08/01/2010  . HYPERGLYCEMIA 08/01/2010  . Obesity, unspecified 08/01/2010  . RENAL DISEASE 08/01/2010  . Senile dementia, uncomplicated 11/09/2010   Past Surgical History  Procedure Laterality Date  . Blood clots-removed  1985  . Tumor removed  1980   Social History   Social History  . Marital Status: Widowed    Spouse Name: N/A  . Number of Children: N/A  . Years of Education: N/A   Occupational History  . retired Advertising copywriter    Social History Main Topics  . Smoking status: Never Smoker   . Smokeless tobacco: Never Used  . Alcohol Use: 3.0 oz/week    5 Shots of liquor per  week  . Drug Use: No  . Sexual Activity: Not Currently   Other Topics Concern  . None   Social History Narrative   Daily Caffeine use   Only finished 2nd grade   Allergies  Allergen Reactions  . Amlodipine     medium  . Tramadol     hallucinations   Family History  Problem Relation Age of Onset  . Diabetes Brother   . Cancer Brother     colon    Past medical history, social, surgical and family history all reviewed in electronic medical record.  No pertanent information unless stated regarding to the chief complaint.   Review of Systems: No headache, visual changes, nausea, vomiting, diarrhea, constipation, dizziness, abdominal pain, skin rash, fevers, chills, night sweats, weight loss, swollen lymph nodes, joint swelling, chest pain, shortness of breath, mood changes.   Objective Blood pressure 142/70, pulse 62, height  (1.6 m), weight 184 lb (83.462 kg), SpO2 95 %.  General: No apparent distress alert and oriented x3 mood and affect normal, dressed appropriately.  HEENT: Pupils equal, extraocular movements intact  Respiratory: Patient's speak in full sentences and does not appear short of breath  Cardiovascular: No lower extremity edema, non tender, no erythema  Skin: Warm dry intact with no signs of infection or rash on extremities or on axial skeleton.  Abdomen: Soft nontender  Neuro: Cranial nerves II through XII are intact, neurovascularly intact in all extremities with 2+ DTRs and  2+ pulses.  Lymph: No lymphadenopathy of posterior or anterior cervical chain or axillae bilaterally.  Gait Walks with a walker antalgic gait  MSK:  Non tender with full range of motion and good stability and symmetric strength and tone of shoulders, elbows, wrist, hip, knee and ankles bilaterally. Significant arthritic changes of multiple joints Knee: Right Valgus deformity noted. Patient does have a large thigh to calf ratio. Instability noted with varus force. Tender to palpation  mostly over the medial joint line ROM full in flexion and extension and lower leg rotation. Ligaments with solid consistent endpoints including ACL, PCL, LCL, MCL. Patellar glide with moderate to severe crepitus. Patellar and quadriceps tendons unremarkable. Hamstring and quadriceps strength is normal. Contralateral knee has arthritic changes but is nontender.  After informed written and verbal consent, patient was seated on exam table. Right knee was prepped with alcohol swab and utilizing anterolateral approach, patient's right knee space was injected with 4:1  marcaine 0.5%: Kenalog 40mg /dL. Patient tolerated the procedure well without immediate complications.     Impression and Recommendations:     This case required medical decision making of moderate complexity.      Note: This dictation was prepared with Dragon dictation along with smaller phrase technology. Any transcriptional errors that result from this process are unintentional.

## 2016-02-29 NOTE — Assessment & Plan Note (Signed)
Patient given injection today and had near complete resolution of pain. We discussed icing regimen. Discussed over-the-counter vitamin D supplementation. We discussed possible bracing which patient declined today but I do think custom bracing could be helpful. Patient is coming back again in 2-3 weeks. At that time if continuing have pain she has done all conservative therapy and she would be a candidate for viscous supplementation.  MONOVISC.

## 2016-02-29 NOTE — Patient Instructions (Signed)
Good to see you  Ice 20 minutes 2 times daily. Usually after activity and before bed. Vitamin D 2000 IU daily  Come back in 3 weeks and if not better we can try monovisc which is the gel you were talking about.

## 2016-03-01 ENCOUNTER — Telehealth: Payer: Self-pay | Admitting: Internal Medicine

## 2016-03-01 NOTE — Telephone Encounter (Signed)
Pt was in yesterday and received an injection. This morning she seen blood in her urine and is wondering if the injection had anything to do with it.  Can you please give her a call

## 2016-03-01 NOTE — Telephone Encounter (Signed)
No

## 2016-03-01 NOTE — Telephone Encounter (Signed)
Lmovm

## 2016-03-03 DEATH — deceased

## 2016-03-05 ENCOUNTER — Other Ambulatory Visit: Payer: Self-pay | Admitting: Internal Medicine

## 2016-03-05 ENCOUNTER — Telehealth: Payer: Self-pay | Admitting: Internal Medicine

## 2016-03-12 ENCOUNTER — Telehealth: Payer: Self-pay

## 2016-03-12 NOTE — Telephone Encounter (Signed)
On 03/12/2016 I [facility] ([facility] The death certificate is for burial. The patient is a patient of Sanda Lingerhomas Jones. The death certificate will be taken to [facility] tomorrow am for signature. On 03/13/2016 I received the death certificate back from Doctor Sanda Lingerhomas Jones. I got the death certificate ready and called the funeral home to let them know the death certificate is ready for pickup.

## 2016-04-02 NOTE — Telephone Encounter (Signed)
Annette Mckay, EMT called stating she is at the pt's home and the pt has passed and wanted to know if Dr. Yetta BarreJones would sign the death certificate.  I told her he would and she left her number if you wanted to call back.  [phone]

## 2016-04-02 NOTE — Telephone Encounter (Signed)
EMS already informed PCP agreed to sign

## 2016-04-02 DEATH — deceased

## 2016-07-25 ENCOUNTER — Ambulatory Visit: Payer: Medicare Other | Admitting: Internal Medicine
